# Patient Record
Sex: Female | Born: 1950 | Race: Black or African American | Hispanic: No
Health system: Midwestern US, Community
[De-identification: ages and names within clinical notes are randomized; demographics above are authoritative.]

## PROBLEM LIST (undated history)

## (undated) DIAGNOSIS — I10 Essential (primary) hypertension: Secondary | ICD-10-CM

## (undated) DIAGNOSIS — T8859XA Other complications of anesthesia, initial encounter: Secondary | ICD-10-CM

## (undated) DIAGNOSIS — D649 Anemia, unspecified: Secondary | ICD-10-CM

## (undated) DIAGNOSIS — M1990 Unspecified osteoarthritis, unspecified site: Secondary | ICD-10-CM

## (undated) DIAGNOSIS — T4145XA Adverse effect of unspecified anesthetic, initial encounter: Secondary | ICD-10-CM

## (undated) DIAGNOSIS — R112 Nausea with vomiting, unspecified: Secondary | ICD-10-CM

## (undated) DIAGNOSIS — Z9889 Other specified postprocedural states: Secondary | ICD-10-CM

## (undated) DIAGNOSIS — K219 Gastro-esophageal reflux disease without esophagitis: Secondary | ICD-10-CM

## (undated) DIAGNOSIS — E119 Type 2 diabetes mellitus without complications: Secondary | ICD-10-CM

## (undated) DIAGNOSIS — I495 Sick sinus syndrome: Secondary | ICD-10-CM

## (undated) DIAGNOSIS — I499 Cardiac arrhythmia, unspecified: Secondary | ICD-10-CM

## (undated) HISTORY — PX: BREAST SURGERY: SHX581

## (undated) HISTORY — PX: ABDOMINAL HYSTERECTOMY: SHX81

## (undated) HISTORY — PX: TUBAL LIGATION: SHX77

## (undated) HISTORY — PX: CHOLECYSTECTOMY: SHX55

---

## 2004-08-19 ENCOUNTER — Ambulatory Visit (HOSPITAL_COMMUNITY): Admission: RE | Admit: 2004-08-19 | Discharge: 2004-08-19 | Payer: Self-pay | Admitting: Gastroenterology

## 2006-05-16 ENCOUNTER — Encounter: Admission: RE | Admit: 2006-05-16 | Discharge: 2006-05-16 | Payer: Self-pay | Admitting: General Surgery

## 2007-05-21 ENCOUNTER — Encounter: Admission: RE | Admit: 2007-05-21 | Discharge: 2007-05-21 | Payer: Self-pay | Admitting: Internal Medicine

## 2008-05-21 ENCOUNTER — Encounter: Admission: RE | Admit: 2008-05-21 | Discharge: 2008-05-21 | Payer: Self-pay | Admitting: Internal Medicine

## 2009-05-24 ENCOUNTER — Encounter: Admission: RE | Admit: 2009-05-24 | Discharge: 2009-05-24 | Payer: Self-pay | Admitting: Family Medicine

## 2010-05-31 ENCOUNTER — Encounter: Admission: RE | Admit: 2010-05-31 | Discharge: 2010-05-31 | Payer: Self-pay | Admitting: Family Medicine

## 2010-12-16 NOTE — Op Note (Signed)
NAMERUBYLEE, ZAMARRIPA              ACCOUNT NO.:  [id number]   MEDICAL RECORD NO.:  [id number]          PATIENT TYPE:  AMB   LOCATION:  ENDO                         FACILITY:  [facility]   PHYSICIAN:  Anselmo Rod, M.D.  DATE OF BIRTH:  01-14-1951   DATE OF PROCEDURE:  08/19/2004  DATE OF DISCHARGE:                                 OPERATIVE REPORT   PROCEDURE PERFORMED:  Screening colonoscopy.   ENDOSCOPIST:  Anselmo Rod, M.D.   INSTRUMENT USED:  Olympus video colonoscope.   INDICATION FOR PROCEDURE:  A 60 year old African-American female underwent  screening colonoscopy to rule out colonic polyps, masses, etc.  Procedure  preparation, informed consent was secured from the patient, and the patient  fasted for eight hours prior to the procedure and prepped with a bottle of  magnesium citrate and a gallon of GoLytely the night prior to the procedure.  Risks and benefits of the procedure including a 10% miss rate of cancer and  polyps was discussed with her, as well.   PREPROCEDURE PHYSICAL EXAMINATION:  The patient had stable vital signs.  Neck supple.  Chest clear to auscultation.  S1, S2 regular.  Abdomen soft  with normal bowel sounds.   DESCRIPTION OF PROCEDURE:  The patient was placed in the left lateral  decubitus position, sedated with 75 mg of Demerol and 6 mg of Versed in slow  incremental doses.  Once the patient was adequately sedated and maintained  on low-flow oxygen with continuous cardiac monitoring.  The Olympus video  colonoscope was advanced from the rectum to the cecum.  The appendiceal  orifice and ileocecal valve were clearly visualized and photographed.  The  terminal ileum appeared healthy and without lesions.  No masses, polyps,  erosions, ulcerations, etc., were seen.  There was an isolated diverticulum  present at 100 cm.  Retroflexion in the rectum revealed no abnormalities.  The patient tolerated the procedure well without immediate complications.   IMPRESSION:  1.  Isolated diverticulum at 100 cm, otherwise unrevealing colonoscopy to      the terminal ileum.  2.  No masses or polyps seen.   RECOMMENDATIONS:  1.  A repeat colonoscopy has been recommended in the next five years unless      the patient develops any abnormal      symptoms in the interim.  2.  Continue a high fiber diet with a liberal fluid intake.  3.  Outpatient follow-up as need arises in the future.      Jyot   JNM/MEDQ  D:  08/19/2004  T:  08/19/2004  Job:  [id number]   cc:   Gabriel Earing, M.D.  [address]  [place]  [place] [address]  Fax: [phone]

## 2011-04-26 ENCOUNTER — Other Ambulatory Visit: Payer: Self-pay | Admitting: Family Medicine

## 2011-04-26 DIAGNOSIS — N644 Mastodynia: Secondary | ICD-10-CM

## 2011-05-05 ENCOUNTER — Ambulatory Visit
Admission: RE | Admit: 2011-05-05 | Discharge: 2011-05-05 | Disposition: A | Discharge status: Home / Self Care | Payer: 59 | Source: Ambulatory Visit | Attending: Family Medicine | Admitting: Family Medicine

## 2011-05-05 DIAGNOSIS — N644 Mastodynia: Secondary | ICD-10-CM

## 2011-05-31 ENCOUNTER — Other Ambulatory Visit: Payer: Self-pay | Admitting: Family Medicine

## 2011-05-31 DIAGNOSIS — Z1231 Encounter for screening mammogram for malignant neoplasm of breast: Secondary | ICD-10-CM

## 2011-06-05 ENCOUNTER — Ambulatory Visit
Admission: RE | Admit: 2011-06-05 | Discharge: 2011-06-05 | Disposition: A | Discharge status: Home / Self Care | Payer: 59 | Source: Ambulatory Visit | Attending: Family Medicine | Admitting: Family Medicine

## 2011-06-05 DIAGNOSIS — Z1231 Encounter for screening mammogram for malignant neoplasm of breast: Secondary | ICD-10-CM

## 2012-03-29 IMAGING — MG MM DIGITAL SCREENING BILAT W/ CAD
5 series · 5 of 5 positions shown · non-contrast
Comparison: none

DG SCREEN MAMMOGRAM BILATERAL
Bilateral CC and MLO view(s) were taken.

DIGITAL SCREENING MAMMOGRAM WITH CAD:
There are scattered fibroglandular densities.  No masses or malignant type calcifications are 
identified.  Compared with prior studies.
Images were processed with CAD.

[R CC]
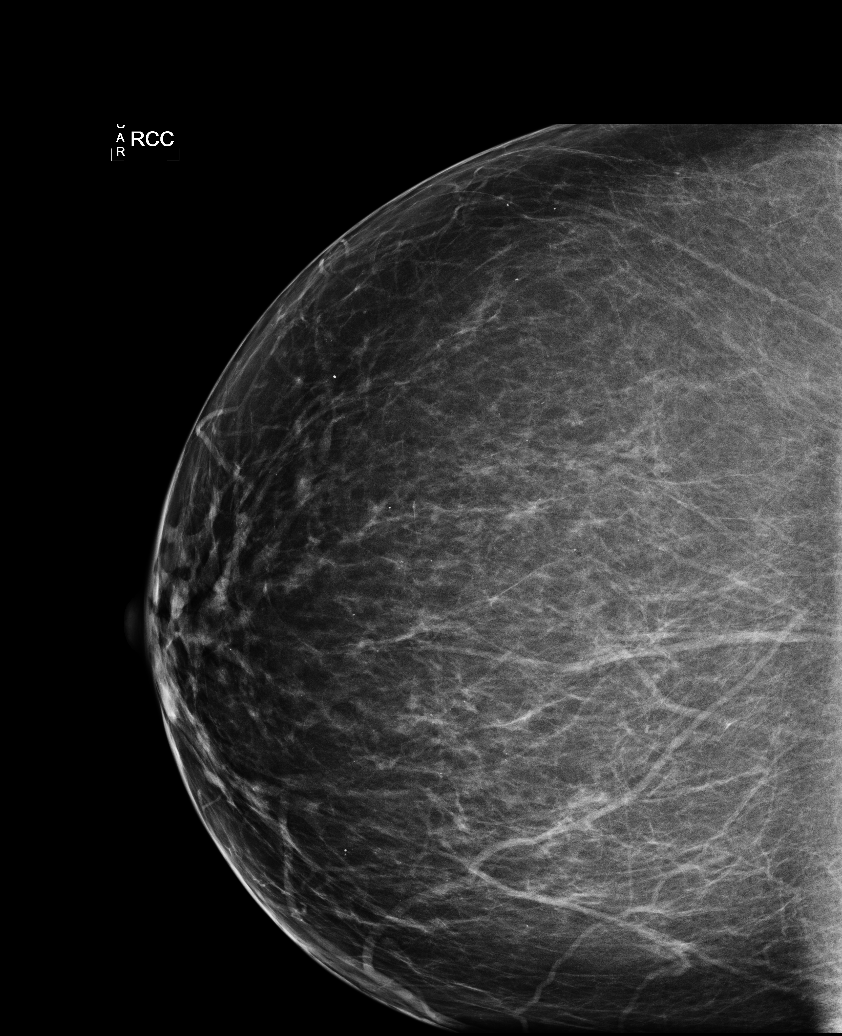

[L CC]
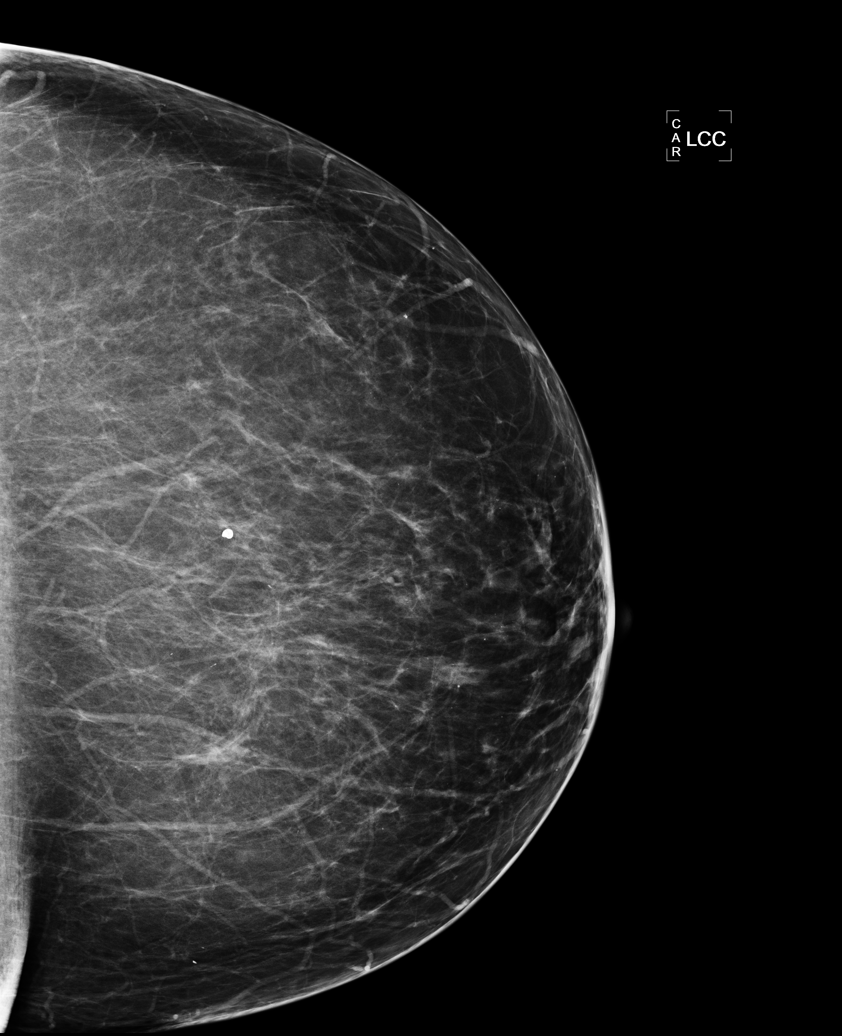

[L MLO]
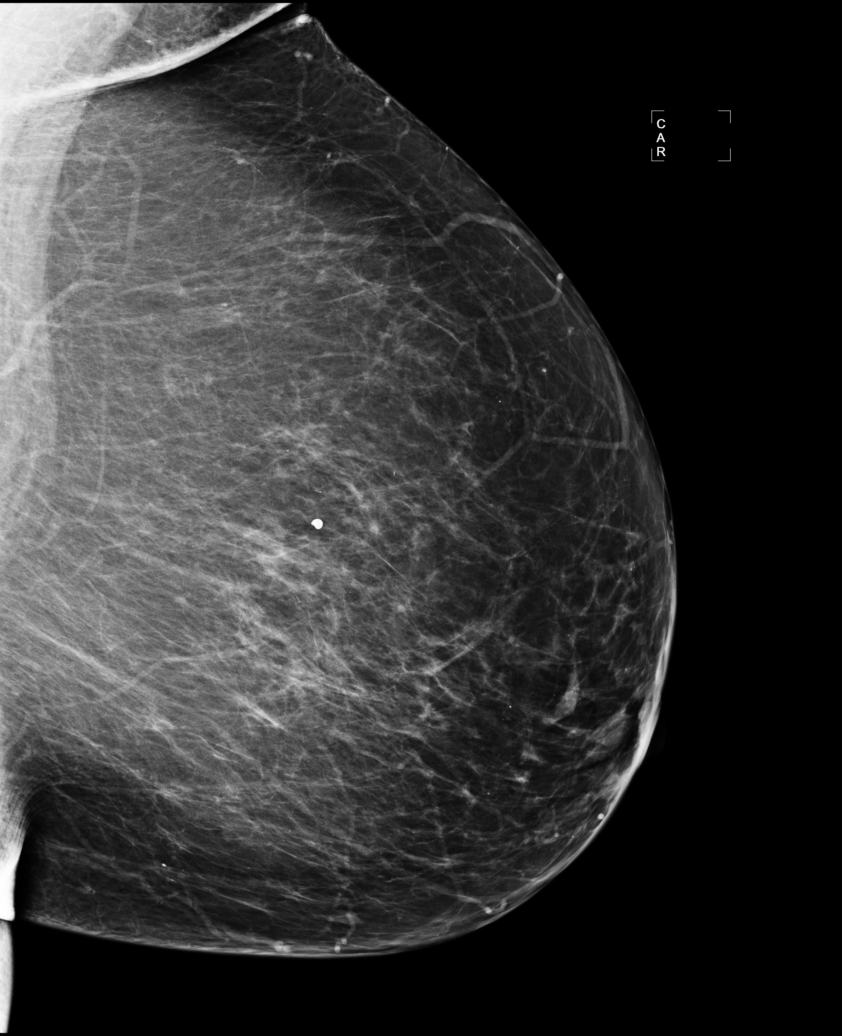

[R MLO]
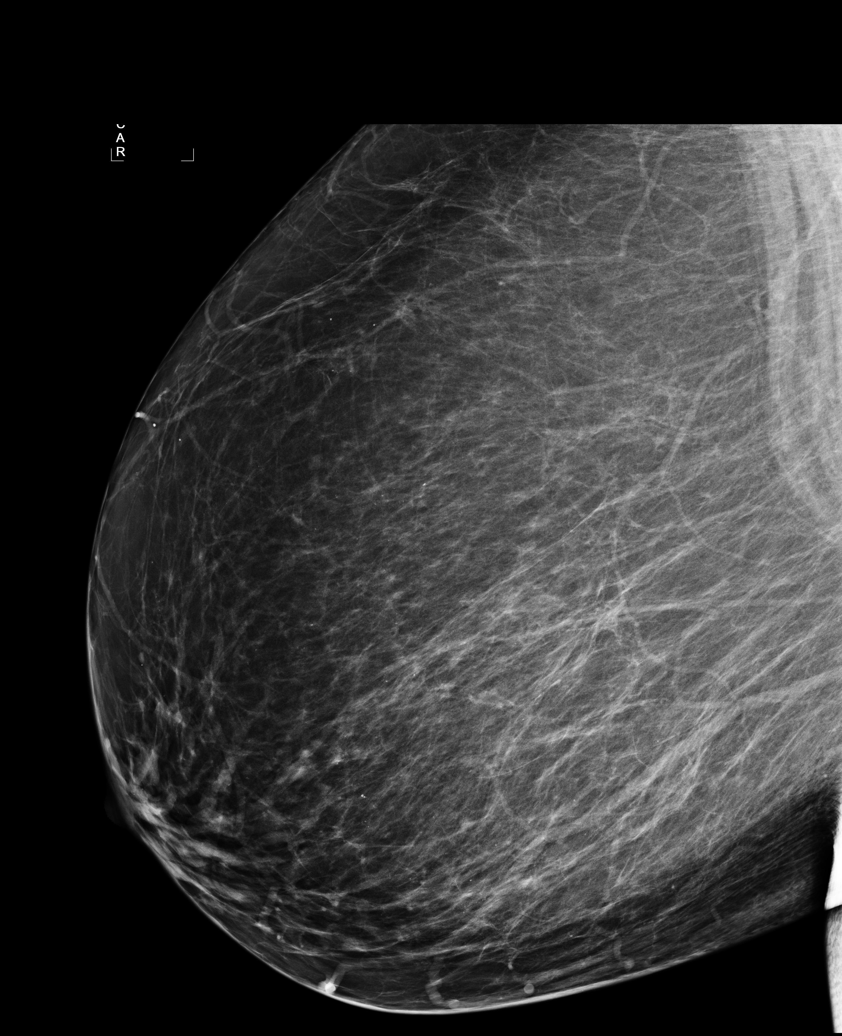

[L CV]
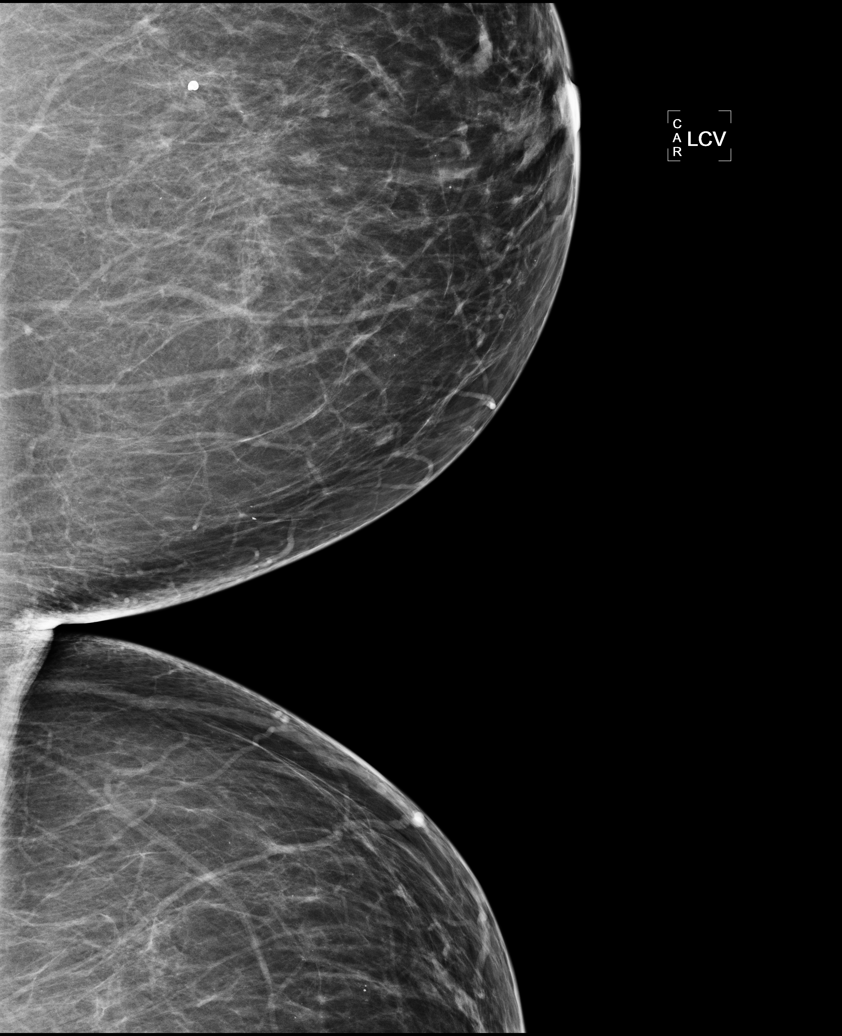

[5 of 5 positions shown; findings below may reference images not displayed]

IMPRESSION: No specific mammographic evidence of malignancy.  Next screening mammogram is recommended in one 
year.

A result letter of this screening mammogram will be mailed directly to the patient.

ASSESSMENT: Negative - BI-RADS 1

Screening mammogram in 1 year.
,

## 2012-05-14 ENCOUNTER — Other Ambulatory Visit: Payer: Self-pay | Admitting: Family Medicine

## 2012-05-14 DIAGNOSIS — Z1231 Encounter for screening mammogram for malignant neoplasm of breast: Secondary | ICD-10-CM

## 2012-06-05 ENCOUNTER — Ambulatory Visit
Admission: RE | Admit: 2012-06-05 | Discharge: 2012-06-05 | Disposition: A | Discharge status: Home / Self Care | Payer: 59 | Source: Ambulatory Visit | Attending: Family Medicine | Admitting: Family Medicine

## 2012-06-05 DIAGNOSIS — Z1231 Encounter for screening mammogram for malignant neoplasm of breast: Secondary | ICD-10-CM

## 2013-04-25 ENCOUNTER — Encounter (HOSPITAL_COMMUNITY): Payer: Self-pay | Admitting: Pharmacy Technician

## 2013-04-25 ENCOUNTER — Other Ambulatory Visit: Payer: Self-pay | Admitting: Orthopaedic Surgery

## 2013-04-26 NOTE — Pre-Procedure Instructions (Signed)
Maytal Mijangos  04/26/2013   Your procedure is scheduled on:  October 2  Report to [facility] "A" [address] at 07:15 AM.  Call this number if you have problems the morning of surgery: [phone]   Remember:   Do not eat food or drink liquids after midnight.   Take these medicines the morning of surgery with A SIP OF WATER: Nexium, Allegra,   STOP Aspirin, Os- Cal, Vitamin D, Meloxicam today  Do not take Aspirin, Aleve, Naproxen, Advil, Ibuprofen, Vitamin, Herbs, or Supplements starting today  Do not wear jewelry, make-up or nail polish.  Do not wear lotions, powders, or perfumes. You may wear deodorant.  Do not shave 48 hours prior to surgery. Men may shave face and neck.  Do not bring valuables to the hospital.  [facility] is not responsible                  for any belongings or valuables.               Contacts, dentures or bridgework may not be worn into surgery.  Leave suitcase in the car. After surgery it may be brought to your room.  For patients admitted to the hospital, discharge time is determined by your                treatment team.               Special Instructions: Shower using CHG 2 nights before surgery and the night before surgery.  If you shower the day of surgery use CHG.  Use special wash - you have one bottle of CHG for all showers.  You should use approximately 1/3 of the bottle for each shower.   Please read over the following fact sheets that you were given: Pain Booklet, Coughing and Deep Breathing, Blood Transfusion Information, Total Joint Packet and Surgical Site Infection Prevention

## 2013-04-28 ENCOUNTER — Encounter (HOSPITAL_COMMUNITY)
Admission: RE | Admit: 2013-04-28 | Discharge: 2013-04-28 | Disposition: A | Discharge status: Home / Self Care | Payer: 59 | Source: Ambulatory Visit | Attending: Orthopaedic Surgery | Admitting: Orthopaedic Surgery

## 2013-04-28 ENCOUNTER — Ambulatory Visit (HOSPITAL_COMMUNITY)
Admission: RE | Admit: 2013-04-28 | Discharge: 2013-04-28 | Disposition: A | Discharge status: Home / Self Care | Payer: 59 | Source: Ambulatory Visit | Attending: Orthopaedic Surgery | Admitting: Orthopaedic Surgery

## 2013-04-28 ENCOUNTER — Encounter (HOSPITAL_COMMUNITY): Payer: Self-pay

## 2013-04-28 DIAGNOSIS — Z01812 Encounter for preprocedural laboratory examination: Secondary | ICD-10-CM | POA: Insufficient documentation

## 2013-04-28 DIAGNOSIS — Z01818 Encounter for other preprocedural examination: Secondary | ICD-10-CM | POA: Insufficient documentation

## 2013-04-28 DIAGNOSIS — Z01810 Encounter for preprocedural cardiovascular examination: Secondary | ICD-10-CM | POA: Insufficient documentation

## 2013-04-28 HISTORY — DX: Gastro-esophageal reflux disease without esophagitis: K21.9

## 2013-04-28 HISTORY — DX: Adverse effect of unspecified anesthetic, initial encounter: T41.45XA

## 2013-04-28 HISTORY — DX: Other specified postprocedural states: R11.2

## 2013-04-28 HISTORY — DX: Essential (primary) hypertension: I10

## 2013-04-28 HISTORY — DX: Anemia, unspecified: D64.9

## 2013-04-28 HISTORY — DX: Type 2 diabetes mellitus without complications: E11.9

## 2013-04-28 HISTORY — DX: Other specified postprocedural states: Z98.890

## 2013-04-28 HISTORY — DX: Other complications of anesthesia, initial encounter: T88.59XA

## 2013-04-28 HISTORY — DX: Unspecified osteoarthritis, unspecified site: M19.90

## 2013-04-28 LAB — BASIC METABOLIC PANEL
BUN: 23 mg/dL (ref 6–23)
CO2: 26 mEq/L (ref 19–32)
Chloride: 99 mEq/L (ref 96–112)
GFR calc Af Amer: 73 mL/min — ABNORMAL LOW (ref >90)
GFR calc non Af Amer: 63 mL/min — ABNORMAL LOW (ref >90)
Potassium: 3.7 mEq/L (ref 3.5–5.1)
Sodium: 136 mEq/L (ref 135–145)

## 2013-04-28 LAB — CBC WITH DIFFERENTIAL/PLATELET
Eosinophils Absolute: 0.1 10*3/uL (ref 0.0–0.7)
HCT: 33.5 % — ABNORMAL LOW (ref 36.0–46.0)
Hemoglobin: 11.1 g/dL — ABNORMAL LOW (ref 12.0–15.0)
Lymphocytes Relative: 50 % — ABNORMAL HIGH (ref 12–46)
Lymphs Abs: 3.2 10*3/uL (ref 0.7–4.0)
MCHC: 33.1 g/dL (ref 30.0–36.0)
MCV: 85.9 fL (ref 78.0–100.0)
Monocytes Absolute: 0.4 10*3/uL (ref 0.1–1.0)
Monocytes Relative: 6 % (ref 3–12)
Neutro Abs: 2.7 10*3/uL (ref 1.7–7.7)
Neutrophils Relative %: 41 % — ABNORMAL LOW (ref 43–77)
RBC: 3.9 MIL/uL (ref 3.87–5.11)
WBC: 6.5 10*3/uL (ref 4.0–10.5)

## 2013-04-28 LAB — PROTIME-INR
INR: 0.96 (ref 0.00–1.49)
Prothrombin Time: 12.6 seconds (ref 11.6–15.2)

## 2013-04-28 LAB — URINALYSIS, ROUTINE W REFLEX MICROSCOPIC
Bilirubin Urine: NEGATIVE
Hgb urine dipstick: NEGATIVE
Ketones, ur: NEGATIVE mg/dL
Nitrite: NEGATIVE
Protein, ur: NEGATIVE mg/dL
pH: 5.5 (ref 5.0–8.0)

## 2013-04-28 LAB — ABO/RH: ABO/RH(D): A POS

## 2013-04-28 LAB — URINE MICROSCOPIC-ADD ON

## 2013-04-28 NOTE — Progress Notes (Signed)
Previous ekg was in [place], prior to her move here in 2005. Pt. Reports long ago she had a stress test as a baseline, told that it was normal.

## 2013-04-28 NOTE — H&P (Signed)
TOTAL KNEE ADMISSION H&P  Patient is being admitted for left total knee arthroplasty.  Subjective:  Chief Complaint:left knee pain.  HPI: Priscilla Brooks, 62 y.o. female, has a history of pain and functional disability in the left knee due to arthritis and has failed non-surgical conservative treatments for greater than 12 weeks to includeNSAID's and/or analgesics, corticosteriod injections, viscosupplementation injections, supervised PT with diminished ADL's post treatment, weight reduction as appropriate and activity modification.  Onset of symptoms was gradual, starting 6 years ago with gradually worsening course since that time. The patient noted no past surgery on the left knee(s).  Patient currently rates pain in the left knee(s) at 9 out of 10 with activity. Patient has night pain, worsening of pain with activity and weight bearing, pain that interferes with activities of daily living and crepitus.  Patient has evidence of subchondral sclerosis, periarticular osteophytes and joint space narrowing by imaging studies. This patient has had none. There is no active infection.  There are no active problems to display for this patient.  Past Medical History  Diagnosis Date  . Complication of anesthesia     wakes slowly  . PONV (postoperative nausea and vomiting)   . Hypertension   . Diabetes mellitus without complication   . GERD (gastroesophageal reflux disease)   . Arthritis     L knees, low back   . Anemia     told that she needs iron daily    Past Surgical History  Procedure Laterality Date  . Abdominal hysterectomy    . Cholecystectomy    . Breast surgery      benign x2- L breast  . Tubal ligation      No prescriptions prior to admission   No Known Allergies  History  Substance Use Topics  . Smoking status: Never Smoker   . Smokeless tobacco: Not on file  . Alcohol Use: Yes     Comment: social - spec. occas    No family history on file.   Review of Systems   Constitutional: Negative.   HENT: Negative.   Eyes: Negative.   Respiratory: Negative.   Cardiovascular: Negative.   Gastrointestinal: Negative.   Genitourinary: Negative.   Musculoskeletal: Positive for joint pain.  Skin: Negative.   Neurological: Negative.   Psychiatric/Behavioral: Negative.     Objective:  Physical Exam  Constitutional: She appears well-nourished.  HENT:  Head: Atraumatic.  Eyes: Conjunctivae are normal.  Neck: Neck supple.  Cardiovascular: Regular rhythm.   Respiratory: Breath sounds normal.  GI: Soft.  Musculoskeletal:  Left knee exam: Range of motion 0-1 10.  Crepitation 1+.  Pain along the medial joint line.  No fluid.  Neurological: She is alert.  Skin: Skin is dry.  Psychiatric: She has a normal mood and affect.    Vital signs in last 24 hours: Temp:  [98.3 F (36.8 C)] 98.3 F (36.8 C) (09/29 1014) Pulse Rate:  [67] 67 (09/29 1014) Resp:  [20] 20 (09/29 1014) BP: (102)/(70) 102/70 mmHg (09/29 1014) SpO2:  [99 %] 99 % (09/29 1014) Weight:  [120.203 kg (265 lb)] 120.203 kg (265 lb) (09/29 1014)  Labs:   There is no height or weight on file to calculate BMI.   Imaging Review Plain radiographs demonstrate severe degenerative joint disease of the left knee(s). The overall alignment isneutral. The bone quality appears to be good for age and reported activity level.  Assessment/Plan:  End stage arthritis, left knee   The patient history, physical examination, clinical judgment  of the provider and imaging studies are consistent with end stage degenerative joint disease of the left knee(s) and total knee arthroplasty is deemed medically necessary. The treatment options including medical management, injection therapy arthroscopy and arthroplasty were discussed at length. The risks and benefits of total knee arthroplasty were presented and reviewed. The risks due to aseptic loosening, infection, stiffness, patella tracking problems,  thromboembolic complications and other imponderables were discussed. The patient acknowledged the explanation, agreed to proceed with the plan and consent was signed. Patient is being admitted for inpatient treatment for surgery, pain control, PT, OT, prophylactic antibiotics, VTE prophylaxis, progressive ambulation and ADL's and discharge planning. The patient is planning to be discharged home with home health services

## 2013-04-28 NOTE — Progress Notes (Signed)
Call to Dr. Jerl Santos, requested that orders be signed.

## 2013-04-30 MED ORDER — DEXTROSE 5 % IV SOLN
3.0000 g | INTRAVENOUS | Status: AC
  Administered 2013-05-01: 3 g via INTRAVENOUS
  Filled 2013-04-30: qty 3000

## 2013-05-01 ENCOUNTER — Encounter (HOSPITAL_COMMUNITY)
Admission: RE | Disposition: A | Discharge status: Skilled Nursing Facility | Payer: Self-pay | Source: Ambulatory Visit | Attending: Orthopaedic Surgery

## 2013-05-01 ENCOUNTER — Inpatient Hospital Stay (HOSPITAL_COMMUNITY)
Admission: RE | Admit: 2013-05-01 | Discharge: 2013-05-05 | DRG: 470 | Disposition: A | Discharge status: Skilled Nursing Facility | Payer: 59 | Source: Ambulatory Visit | Attending: Orthopaedic Surgery | Admitting: Orthopaedic Surgery

## 2013-05-01 ENCOUNTER — Encounter (HOSPITAL_COMMUNITY): Payer: Self-pay | Admitting: Critical Care Medicine

## 2013-05-01 ENCOUNTER — Inpatient Hospital Stay (HOSPITAL_COMMUNITY): Payer: 59 | Admitting: Critical Care Medicine

## 2013-05-01 DIAGNOSIS — E119 Type 2 diabetes mellitus without complications: Secondary | ICD-10-CM | POA: Diagnosis present

## 2013-05-01 DIAGNOSIS — D62 Acute posthemorrhagic anemia: Secondary | ICD-10-CM | POA: Diagnosis not present

## 2013-05-01 DIAGNOSIS — K219 Gastro-esophageal reflux disease without esophagitis: Secondary | ICD-10-CM | POA: Diagnosis present

## 2013-05-01 DIAGNOSIS — Z7982 Long term (current) use of aspirin: Secondary | ICD-10-CM

## 2013-05-01 DIAGNOSIS — M1710 Unilateral primary osteoarthritis, unspecified knee: Secondary | ICD-10-CM | POA: Insufficient documentation

## 2013-05-01 DIAGNOSIS — M1712 Unilateral primary osteoarthritis, left knee: Secondary | ICD-10-CM

## 2013-05-01 DIAGNOSIS — I10 Essential (primary) hypertension: Secondary | ICD-10-CM | POA: Diagnosis present

## 2013-05-01 DIAGNOSIS — M25569 Pain in unspecified knee: Secondary | ICD-10-CM | POA: Diagnosis present

## 2013-05-01 DIAGNOSIS — Z9089 Acquired absence of other organs: Secondary | ICD-10-CM

## 2013-05-01 DIAGNOSIS — Z79899 Other long term (current) drug therapy: Secondary | ICD-10-CM

## 2013-05-01 DIAGNOSIS — Z9851 Tubal ligation status: Secondary | ICD-10-CM

## 2013-05-01 DIAGNOSIS — Z23 Encounter for immunization: Secondary | ICD-10-CM

## 2013-05-01 HISTORY — PX: TOTAL KNEE ARTHROPLASTY: SHX125

## 2013-05-01 LAB — GLUCOSE, CAPILLARY
Glucose-Capillary: 100 mg/dL — ABNORMAL HIGH (ref 70–99)
Glucose-Capillary: 177 mg/dL — ABNORMAL HIGH (ref 70–99)
Glucose-Capillary: 143 mg/dL — ABNORMAL HIGH (ref 70–99)
Glucose-Capillary: 151 mg/dL — ABNORMAL HIGH (ref 70–99)

## 2013-05-01 SURGERY — ARTHROPLASTY, KNEE, TOTAL
Anesthesia: Regional | Site: Knee | Laterality: Left | Wound class: Clean

## 2013-05-01 MED ORDER — METHOCARBAMOL 100 MG/ML IJ SOLN
500.0000 mg | Freq: Four times a day (QID) | INTRAVENOUS | Status: DC | PRN
  Filled 2013-05-01: qty 5

## 2013-05-01 MED ORDER — HYDROCHLOROTHIAZIDE 12.5 MG PO CAPS
12.5000 mg | ORAL_CAPSULE | Freq: Every day | ORAL | Status: DC
  Administered 2013-05-02 – 2013-05-04 (×3): 12.5 mg via ORAL
  Filled 2013-05-01 (×4): qty 1

## 2013-05-01 MED ORDER — ONDANSETRON HCL 4 MG/2ML IJ SOLN: 4.0000 mg | Freq: Four times a day (QID) | INTRAMUSCULAR | Status: DC | PRN

## 2013-05-01 MED ORDER — MENTHOL 3 MG MT LOZG: 1.0000 | LOZENGE | OROMUCOSAL | Status: DC | PRN

## 2013-05-01 MED ORDER — METOCLOPRAMIDE HCL 5 MG/ML IJ SOLN
INTRAMUSCULAR | Status: DC | PRN
  Administered 2013-05-01: 10 mg via INTRAVENOUS

## 2013-05-01 MED ORDER — LISINOPRIL-HYDROCHLOROTHIAZIDE 20-12.5 MG PO TABS: 1.0000 | ORAL_TABLET | Freq: Every day | ORAL | Status: DC

## 2013-05-01 MED ORDER — METOCLOPRAMIDE HCL 5 MG/ML IJ SOLN
INTRAMUSCULAR | Status: AC
  Filled 2013-05-01: qty 2

## 2013-05-01 MED ORDER — HYDROMORPHONE HCL PF 1 MG/ML IJ SOLN
0.5000 mg | INTRAMUSCULAR | Status: DC | PRN
  Administered 2013-05-02: 1 mg via INTRAVENOUS
  Filled 2013-05-01: qty 1

## 2013-05-01 MED ORDER — DOCUSATE SODIUM 100 MG PO CAPS
100.0000 mg | ORAL_CAPSULE | Freq: Two times a day (BID) | ORAL | Status: DC
  Administered 2013-05-01 – 2013-05-05 (×8): 100 mg via ORAL
  Filled 2013-05-01 (×8): qty 1

## 2013-05-01 MED ORDER — HYDROMORPHONE HCL PF 1 MG/ML IJ SOLN
INTRAMUSCULAR | Status: AC
  Filled 2013-05-01: qty 1

## 2013-05-01 MED ORDER — ASPIRIN EC 325 MG PO TBEC
325.0000 mg | DELAYED_RELEASE_TABLET | Freq: Two times a day (BID) | ORAL | Status: DC
  Administered 2013-05-02 – 2013-05-05 (×7): 325 mg via ORAL
  Filled 2013-05-01 (×9): qty 1

## 2013-05-01 MED ORDER — FERROUS SULFATE 325 (65 FE) MG PO TABS
325.0000 mg | ORAL_TABLET | Freq: Every day | ORAL | Status: DC
  Administered 2013-05-02 – 2013-05-04 (×3): 325 mg via ORAL
  Filled 2013-05-01 (×5): qty 1

## 2013-05-01 MED ORDER — HYDROMORPHONE HCL PF 1 MG/ML IJ SOLN
0.2500 mg | INTRAMUSCULAR | Status: DC | PRN
  Administered 2013-05-01 (×2): 0.5 mg via INTRAVENOUS

## 2013-05-01 MED ORDER — METOCLOPRAMIDE HCL 5 MG/ML IJ SOLN
5.0000 mg | Freq: Three times a day (TID) | INTRAMUSCULAR | Status: DC | PRN
  Administered 2013-05-01: 10 mg via INTRAVENOUS

## 2013-05-01 MED ORDER — HYDROCODONE-ACETAMINOPHEN 5-325 MG PO TABS
ORAL_TABLET | ORAL | Status: AC
  Filled 2013-05-01: qty 3

## 2013-05-01 MED ORDER — TRANEXAMIC ACID 100 MG/ML IV SOLN
1000.0000 mg | INTRAVENOUS | Status: AC
  Administered 2013-05-01: 1000 mg via INTRAVENOUS
  Filled 2013-05-01: qty 10

## 2013-05-01 MED ORDER — ATORVASTATIN CALCIUM 10 MG PO TABS
10.0000 mg | ORAL_TABLET | Freq: Every day | ORAL | Status: DC
  Administered 2013-05-01 – 2013-05-04 (×4): 10 mg via ORAL
  Filled 2013-05-01 (×5): qty 1

## 2013-05-01 MED ORDER — BISACODYL 10 MG RE SUPP
10.0000 mg | Freq: Every day | RECTAL | Status: DC | PRN
  Administered 2013-05-05: 10 mg via RECTAL
  Filled 2013-05-01: qty 1

## 2013-05-01 MED ORDER — LISINOPRIL 20 MG PO TABS
20.0000 mg | ORAL_TABLET | Freq: Every day | ORAL | Status: DC
  Administered 2013-05-02 – 2013-05-04 (×3): 20 mg via ORAL
  Filled 2013-05-01 (×4): qty 1

## 2013-05-01 MED ORDER — MIDAZOLAM HCL 2 MG/2ML IJ SOLN
1.0000 mg | INTRAMUSCULAR | Status: DC | PRN
Start: 2013-05-01 — End: 2013-05-01
  Administered 2013-05-01: 1 mg via INTRAVENOUS
  Filled 2013-05-01: qty 2

## 2013-05-01 MED ORDER — ACETAMINOPHEN 325 MG PO TABS: 650.0000 mg | ORAL_TABLET | Freq: Four times a day (QID) | ORAL | Status: DC | PRN

## 2013-05-01 MED ORDER — GLYCOPYRROLATE 0.2 MG/ML IJ SOLN
INTRAMUSCULAR | Status: DC | PRN
  Administered 2013-05-01: 0.4 mg via INTRAVENOUS

## 2013-05-01 MED ORDER — PANTOPRAZOLE SODIUM 40 MG PO TBEC
80.0000 mg | DELAYED_RELEASE_TABLET | Freq: Every day | ORAL | Status: DC
  Administered 2013-05-02 – 2013-05-05 (×4): 80 mg via ORAL
  Filled 2013-05-01 (×3): qty 2

## 2013-05-01 MED ORDER — SODIUM CHLORIDE 0.9 % IR SOLN
Status: DC | PRN
  Administered 2013-05-01: 3000 mL

## 2013-05-01 MED ORDER — INSULIN ASPART 100 UNIT/ML ~~LOC~~ SOLN
0.0000 [IU] | Freq: Three times a day (TID) | SUBCUTANEOUS | Status: DC
  Administered 2013-05-01: 3 [IU] via SUBCUTANEOUS

## 2013-05-01 MED ORDER — METOCLOPRAMIDE HCL 5 MG PO TABS
5.0000 mg | ORAL_TABLET | Freq: Three times a day (TID) | ORAL | Status: DC | PRN
  Filled 2013-05-01: qty 2

## 2013-05-01 MED ORDER — ALUM & MAG HYDROXIDE-SIMETH 200-200-20 MG/5ML PO SUSP: 30.0000 mL | ORAL | Status: DC | PRN

## 2013-05-01 MED ORDER — BUPIVACAINE-EPINEPHRINE PF 0.5-1:200000 % IJ SOLN
INTRAMUSCULAR | Status: DC | PRN
  Administered 2013-05-01: 30 mL

## 2013-05-01 MED ORDER — HYDROCODONE-ACETAMINOPHEN 5-325 MG PO TABS
ORAL_TABLET | ORAL | Status: AC
  Administered 2013-05-01: 3
  Filled 2013-05-01: qty 3

## 2013-05-01 MED ORDER — ACETAMINOPHEN 650 MG RE SUPP: 650.0000 mg | Freq: Four times a day (QID) | RECTAL | Status: DC | PRN

## 2013-05-01 MED ORDER — PROPOFOL 10 MG/ML IV BOLUS
INTRAVENOUS | Status: DC | PRN
  Administered 2013-05-01: 180 mg via INTRAVENOUS

## 2013-05-01 MED ORDER — METHOCARBAMOL 500 MG PO TABS
ORAL_TABLET | ORAL | Status: AC
  Filled 2013-05-01: qty 1

## 2013-05-01 MED ORDER — PIOGLITAZONE HCL 15 MG PO TABS
15.0000 mg | ORAL_TABLET | Freq: Every day | ORAL | Status: DC
  Administered 2013-05-02 – 2013-05-05 (×4): 15 mg via ORAL
  Filled 2013-05-01 (×5): qty 1

## 2013-05-01 MED ORDER — ROCURONIUM BROMIDE 100 MG/10ML IV SOLN
INTRAVENOUS | Status: DC | PRN
  Administered 2013-05-01: 50 mg via INTRAVENOUS

## 2013-05-01 MED ORDER — OXYCODONE HCL 5 MG/5ML PO SOLN: 5.0000 mg | Freq: Once | ORAL | Status: DC | PRN

## 2013-05-01 MED ORDER — ONDANSETRON HCL 4 MG/2ML IJ SOLN
INTRAMUSCULAR | Status: DC | PRN
  Administered 2013-05-01 (×2): 4 mg via INTRAVENOUS

## 2013-05-01 MED ORDER — ONDANSETRON HCL 4 MG PO TABS: 4.0000 mg | ORAL_TABLET | Freq: Four times a day (QID) | ORAL | Status: DC | PRN

## 2013-05-01 MED ORDER — FENTANYL CITRATE 0.05 MG/ML IJ SOLN
INTRAMUSCULAR | Status: DC | PRN
  Administered 2013-05-01 (×5): 50 ug via INTRAVENOUS

## 2013-05-01 MED ORDER — CEFAZOLIN SODIUM-DEXTROSE 2-3 GM-% IV SOLR
2.0000 g | Freq: Four times a day (QID) | INTRAVENOUS | Status: AC
  Administered 2013-05-01 (×2): 2 g via INTRAVENOUS
  Filled 2013-05-01 (×2): qty 50

## 2013-05-01 MED ORDER — DEXAMETHASONE SODIUM PHOSPHATE 4 MG/ML IJ SOLN
INTRAMUSCULAR | Status: DC | PRN
  Administered 2013-05-01: 4 mg via INTRAVENOUS

## 2013-05-01 MED ORDER — HYDROCODONE-ACETAMINOPHEN 7.5-325 MG PO TABS
1.0000 | ORAL_TABLET | ORAL | Status: DC | PRN
  Administered 2013-05-01 – 2013-05-05 (×11): 2 via ORAL
  Filled 2013-05-01 (×11): qty 2

## 2013-05-01 MED ORDER — NEOSTIGMINE METHYLSULFATE 1 MG/ML IJ SOLN
INTRAMUSCULAR | Status: DC | PRN
  Administered 2013-05-01: 3 mg via INTRAVENOUS

## 2013-05-01 MED ORDER — PHENOL 1.4 % MT LIQD: 1.0000 | OROMUCOSAL | Status: DC | PRN

## 2013-05-01 MED ORDER — DIPHENHYDRAMINE HCL 12.5 MG/5ML PO ELIX: 12.5000 mg | ORAL_SOLUTION | ORAL | Status: DC | PRN

## 2013-05-01 MED ORDER — FENTANYL CITRATE 0.05 MG/ML IJ SOLN
50.0000 ug | INTRAMUSCULAR | Status: DC | PRN
  Administered 2013-05-01: 100 ug via INTRAVENOUS
  Filled 2013-05-01: qty 2

## 2013-05-01 MED ORDER — LORATADINE 10 MG PO TABS
10.0000 mg | ORAL_TABLET | Freq: Every day | ORAL | Status: DC
  Administered 2013-05-02 – 2013-05-05 (×4): 10 mg via ORAL
  Filled 2013-05-01 (×4): qty 1

## 2013-05-01 MED ORDER — LACTATED RINGERS IV SOLN
INTRAVENOUS | Status: DC
  Administered 2013-05-01: 22:00:00 via INTRAVENOUS

## 2013-05-01 MED ORDER — PHENYLEPHRINE HCL 10 MG/ML IJ SOLN
INTRAMUSCULAR | Status: DC | PRN
  Administered 2013-05-01: 80 ug via INTRAVENOUS

## 2013-05-01 MED ORDER — OXYCODONE HCL 5 MG PO TABS: 5.0000 mg | ORAL_TABLET | Freq: Once | ORAL | Status: DC | PRN

## 2013-05-01 MED ORDER — LIDOCAINE HCL (CARDIAC) 20 MG/ML IV SOLN
INTRAVENOUS | Status: DC | PRN
  Administered 2013-05-01: 80 mg via INTRAVENOUS

## 2013-05-01 MED ORDER — METHOCARBAMOL 500 MG PO TABS
500.0000 mg | ORAL_TABLET | Freq: Four times a day (QID) | ORAL | Status: DC | PRN
  Administered 2013-05-01 – 2013-05-04 (×6): 500 mg via ORAL
  Filled 2013-05-01 (×5): qty 1

## 2013-05-01 MED ORDER — LACTATED RINGERS IV SOLN
INTRAVENOUS | Status: DC
  Administered 2013-05-01 (×2): via INTRAVENOUS

## 2013-05-01 SURGICAL SUPPLY — 67 items
BANDAGE ELASTIC 4 VELCRO ST LF (GAUZE/BANDAGES/DRESSINGS) ×2 IMPLANT
BANDAGE ELASTIC 6 VELCRO ST LF (GAUZE/BANDAGES/DRESSINGS) ×1 IMPLANT
BANDAGE ESMARK 6X9 LF (GAUZE/BANDAGES/DRESSINGS) ×1 IMPLANT
BANDAGE GAUZE ELAST BULKY 4 IN (GAUZE/BANDAGES/DRESSINGS) ×3 IMPLANT
BLADE SAGITTAL 25.0X1.19X90 (BLADE) ×2 IMPLANT
BLADE SURG ROTATE 9660 (MISCELLANEOUS) IMPLANT
BNDG CMPR 9X6 STRL LF SNTH (GAUZE/BANDAGES/DRESSINGS) ×1
BNDG CMPR MED 10X6 ELC LF (GAUZE/BANDAGES/DRESSINGS) ×1
BNDG ELASTIC 6X10 VLCR STRL LF (GAUZE/BANDAGES/DRESSINGS) ×2 IMPLANT
BNDG ESMARK 6X9 LF (GAUZE/BANDAGES/DRESSINGS) ×2
BOWL SMART MIX CTS (DISPOSABLE) ×2 IMPLANT
CAP UPCHARGE REVISION TRAY ×1 IMPLANT
CAPT RP KNEE ×1 IMPLANT
CEMENT HV SMART SET (Cement) ×4 IMPLANT
CLOTH BEACON ORANGE TIMEOUT ST (SAFETY) ×2 IMPLANT
COVER SURGICAL LIGHT HANDLE (MISCELLANEOUS) ×2 IMPLANT
CUFF TOURNIQUET SINGLE 34IN LL (TOURNIQUET CUFF) ×2 IMPLANT
CUFF TOURNIQUET SINGLE 44IN (TOURNIQUET CUFF) IMPLANT
DRAPE EXTREMITY T 121X128X90 (DRAPE) ×2 IMPLANT
DRAPE PROXIMA HALF (DRAPES) ×2 IMPLANT
DRAPE U-SHAPE 47X51 STRL (DRAPES) ×2 IMPLANT
DRSG ADAPTIC 3X8 NADH LF (GAUZE/BANDAGES/DRESSINGS) ×2 IMPLANT
DRSG PAD ABDOMINAL 8X10 ST (GAUZE/BANDAGES/DRESSINGS) ×2 IMPLANT
DURAPREP 26ML APPLICATOR (WOUND CARE) ×2 IMPLANT
ELECT REM PT RETURN 9FT ADLT (ELECTROSURGICAL) ×2
ELECTRODE REM PT RTRN 9FT ADLT (ELECTROSURGICAL) ×1 IMPLANT
FACESHIELD LNG OPTICON STERILE (SAFETY) ×5 IMPLANT
GLOVE BIO SURGEON STRL SZ8.5 (GLOVE) ×2 IMPLANT
GLOVE BIOGEL PI IND STRL 8 (GLOVE) ×1 IMPLANT
GLOVE BIOGEL PI IND STRL 8.5 (GLOVE) ×1 IMPLANT
GLOVE BIOGEL PI INDICATOR 8 (GLOVE) ×1
GLOVE BIOGEL PI INDICATOR 8.5 (GLOVE) ×1
GLOVE SS BIOGEL STRL SZ 8 (GLOVE) ×1 IMPLANT
GLOVE SUPERSENSE BIOGEL SZ 8 (GLOVE) ×1
GOWN PREVENTION PLUS XLARGE (GOWN DISPOSABLE) ×2 IMPLANT
GOWN PREVENTION PLUS XXLARGE (GOWN DISPOSABLE) ×2 IMPLANT
GOWN STRL NON-REIN LRG LVL3 (GOWN DISPOSABLE) ×2 IMPLANT
HANDPIECE INTERPULSE COAX TIP (DISPOSABLE) ×2
HOOD PEEL AWAY FACE SHEILD DIS (HOOD) ×2 IMPLANT
IMMOBILIZER KNEE 20 (SOFTGOODS)
IMMOBILIZER KNEE 20 THIGH 36 (SOFTGOODS) IMPLANT
IMMOBILIZER KNEE 22 UNIV (SOFTGOODS) ×2 IMPLANT
IMMOBILIZER KNEE 24 THIGH 36 (MISCELLANEOUS) IMPLANT
IMMOBILIZER KNEE 24 UNIV (MISCELLANEOUS)
KIT BASIN OR (CUSTOM PROCEDURE TRAY) ×2 IMPLANT
KIT ROOM TURNOVER OR (KITS) ×2 IMPLANT
MANIFOLD NEPTUNE II (INSTRUMENTS) ×2 IMPLANT
NDL HYPO 21X1 ECLIPSE (NEEDLE) ×1 IMPLANT
NEEDLE HYPO 21X1 ECLIPSE (NEEDLE) ×2 IMPLANT
NS IRRIG 1000ML POUR BTL (IV SOLUTION) ×2 IMPLANT
PACK TOTAL JOINT (CUSTOM PROCEDURE TRAY) ×2 IMPLANT
PAD ARMBOARD 7.5X6 YLW CONV (MISCELLANEOUS) ×4 IMPLANT
SET HNDPC FAN SPRY TIP SCT (DISPOSABLE) ×1 IMPLANT
SPONGE GAUZE 4X4 12PLY (GAUZE/BANDAGES/DRESSINGS) ×2 IMPLANT
STAPLER VISISTAT 35W (STAPLE) IMPLANT
SUCTION FRAZIER TIP 10 FR DISP (SUCTIONS) IMPLANT
SUT MNCRL AB 3-0 PS2 18 (SUTURE) IMPLANT
SUT VIC AB 0 CT1 27 (SUTURE) ×4
SUT VIC AB 0 CT1 27XBRD ANBCTR (SUTURE) ×2 IMPLANT
SUT VIC AB 2-0 CT1 27 (SUTURE) ×4
SUT VIC AB 2-0 CT1 TAPERPNT 27 (SUTURE) ×2 IMPLANT
SUT VLOC 180 0 24IN GS25 (SUTURE) ×2 IMPLANT
SYR 50ML LL SCALE MARK (SYRINGE) ×2 IMPLANT
TOWEL OR 17X24 6PK STRL BLUE (TOWEL DISPOSABLE) ×2 IMPLANT
TOWEL OR 17X26 10 PK STRL BLUE (TOWEL DISPOSABLE) ×2 IMPLANT
TRAY FOLEY CATH 14FR (SET/KITS/TRAYS/PACK) ×2 IMPLANT
WATER STERILE IRR 1000ML POUR (IV SOLUTION) ×4 IMPLANT

## 2013-05-01 NOTE — Interval H&P Note (Signed)
History and Physical Interval Note:  05/01/2013 8:42 AM  Priscilla Brooks  has presented today for surgery, with the diagnosis of LEFT KNEE DEGENERATIVE JOINT DISEASE   The various methods of treatment have been discussed with the patient and family. After consideration of risks, benefits and other options for treatment, the patient has consented to  Procedure(s): TOTAL KNEE ARTHROPLASTY (Left) as a surgical intervention .  The patient's history has been reviewed, patient examined, no change in status, stable for surgery.  I have reviewed the patient's chart and labs.  Questions were answered to the patient's satisfaction.     Oaklen Thiam G

## 2013-05-01 NOTE — Anesthesia Preprocedure Evaluation (Addendum)
Anesthesia Evaluation  Patient identified by MRN, date of birth, ID band Patient awake    Reviewed: Allergy & Precautions, H&P , NPO status , Patient's Chart, lab work & pertinent test results  History of Anesthesia Complications (+) PONV and PROLONGED EMERGENCE  Airway Mallampati: II  Neck ROM: full    Dental  (+) Dental Advisory Given   Pulmonary          Cardiovascular hypertension, Pt. on medications     Neuro/Psych    GI/Hepatic GERD-  Medicated,  Endo/Other  diabetes, Oral Hypoglycemic AgentsMorbid obesity  Renal/GU      Musculoskeletal  (+) Arthritis -,   Abdominal   Peds  Hematology   Anesthesia Other Findings   Reproductive/Obstetrics                          Anesthesia Physical Anesthesia Plan  ASA: III  Anesthesia Plan: General and Regional   Post-op Pain Management: MAC Combined w/ Regional for Post-op pain   Induction: Intravenous  Airway Management Planned: Oral ETT  Additional Equipment:   Intra-op Plan:   Post-operative Plan: Extubation in OR  Informed Consent: I have reviewed the patients History and Physical, chart, labs and discussed the procedure including the risks, benefits and alternatives for the proposed anesthesia with the patient or authorized representative who has indicated his/her understanding and acceptance.     Plan Discussed with: CRNA, Anesthesiologist and Surgeon  Anesthesia Plan Comments:         Anesthesia Quick Evaluation

## 2013-05-01 NOTE — Anesthesia Postprocedure Evaluation (Signed)
Anesthesia Post Note  Patient: Priscilla Brooks  Procedure(s) Performed: Procedure(s) (LRB): TOTAL KNEE ARTHROPLASTY (Left)  Anesthesia type: General  Patient location: PACU  Post pain: Pain level controlled and Adequate analgesia  Post assessment: Post-op Vital signs reviewed, Patient's Cardiovascular Status Stable, Respiratory Function Stable, Patent Airway and Pain level controlled  Last Vitals:  Filed Vitals:   05/01/13 1052  BP: 125/70  Pulse: 86  Temp: 36.6 C  Resp: 16    Post vital signs: Reviewed and stable  Level of consciousness: awake, alert  and oriented  Complications: No apparent anesthesia complications

## 2013-05-01 NOTE — Anesthesia Procedure Notes (Addendum)
Anesthesia Regional Block:  Femoral nerve block  Pre-Anesthetic Checklist: ,, timeout performed, Correct Patient, Correct Site, Correct Laterality, Correct Procedure,, site marked, risks and benefits discussed, Surgical consent,  Pre-op evaluation,  At surgeon's request and post-op pain management  Laterality: Left  Prep: chloraprep       Needles:  Injection technique: Single-shot  Needle Type: Echogenic Stimulator Needle     Needle Length: 9cm  Needle Gauge: 21 and 21 G    Additional Needles:  Procedures: nerve stimulator Femoral nerve block  Nerve Stimulator or Paresthesia:  Response: Quadriceps muscle contraction, 0.45 mA,   Additional Responses:   Narrative:  Start time: 05/01/2013 7:52 AM End time: 05/01/2013 8:02 AM Injection made incrementally with aspirations every 5 mL.  Performed by: Personally  Anesthesiologist: Dr Chaney Malling  Additional Notes: Functioning IV was confirmed and monitors were applied.  A 90mm 21ga Arrow echogenic stimulator needle was used. Sterile prep and drape,hand hygiene and sterile gloves were used.  Negative aspiration and negative test dose prior to incremental administration of local anesthetic. The patient tolerated the procedure well.    Femoral nerve block Procedure Name: Intubation Date/Time: 05/01/2013 8:53 AM Performed by: Elon Alas Pre-anesthesia Checklist: Patient identified, Timeout performed, Emergency Drugs available, Suction available and Patient being monitored Patient Re-evaluated:Patient Re-evaluated prior to inductionOxygen Delivery Method: Circle system utilized Preoxygenation: Pre-oxygenation with 100% oxygen Intubation Type: IV induction Ventilation: Mask ventilation without difficulty and Oral airway inserted - appropriate to patient size Laryngoscope Size: Miller and 2 Grade View: Grade I Tube type: Oral Tube size: 7.5 mm Number of attempts: 1 Airway Equipment and Method: Stylet Placement Confirmation:  positive ETCO2,  ETT inserted through vocal cords under direct vision and breath sounds checked- equal and bilateral Secured at: 22 cm Tube secured with: Tape Dental Injury: Teeth and Oropharynx as per pre-operative assessment

## 2013-05-01 NOTE — Progress Notes (Signed)
Orthopedic Tech Progress Note Patient Details:  Priscilla Brooks 1951-07-17 [id number] CPM applied to Left LE with appropriate settings. OHF applied to bed.  CPM Left Knee CPM Left Knee: On Left Knee Flexion (Degrees): 60 Left Knee Extension (Degrees): 0   Asia R Thompson 05/01/2013, 11:49 AM

## 2013-05-01 NOTE — Transfer of Care (Signed)
Immediate Anesthesia Transfer of Care Note  Patient: Priscilla Brooks  Procedure(s) Performed: Procedure(s): TOTAL KNEE ARTHROPLASTY (Left)  Patient Location: PACU  Anesthesia Type:General  Level of Consciousness: awake, alert  and oriented  Airway & Oxygen Therapy: Patient Spontanous Breathing and Patient connected to nasal cannula oxygen  Post-op Assessment: Report given to PACU RN, Post -op Vital signs reviewed and stable and Patient moving all extremities X 4  Post vital signs: Reviewed and stable  Complications: No apparent anesthesia complications

## 2013-05-01 NOTE — Preoperative (Signed)
Beta Blockers   Reason not to administer Beta Blockers:Not Applicable 

## 2013-05-01 NOTE — Op Note (Signed)
PREOP DIAGNOSIS: DJD LEFT KNEE POSTOP DIAGNOSIS:  same PROCEDURE: LEFT TKR ANESTHESIA: General and block ATTENDING SURGEON: Teyton Pattillo G ASSISTANT: Lindwood Qua PA and April Green RNFA  INDICATIONS FOR PROCEDURE: Priscilla Brooks is a 62 y.o. female who has struggled for a long time with pain due to degenerative arthritis of the left knee.  The patient has failed many conservative non-operative measures and at this point has pain which limits the ability to sleep and walk.  The patient is offered total knee replacement.  Informed operative consent was obtained after discussion of possible risks of anesthesia, infection, neurovascular injury, DVT, and death.  The importance of the post-operative rehabilitation protocol to optimize result was stressed extensively with the patient.  SUMMARY OF FINDINGS AND PROCEDURE:  Priscilla Brooks was taken to the operative suite where under the above anesthesia a left knee replacement was performed.  There were advanced degenerative changes and the bone quality was good.  We used the DePuy system and placed size standard plus femur, 5 MBT tibia, 38 mm all polyethylene patella, and a size 12.5 mm spacer.  The patient was admitted for appropriate post-op care to include perioperative antibiotics and mechanical and pharmacologic measures for DVT prophylaxis.  DESCRIPTION OF PROCEDURE:  Priscilla Brooks was taken to the operative suite where the above anesthesia was applied.  The patient was positioned supine and prepped and draped in normal sterile fashion.  An appropriate time out was performed.  After the administration of Kefzol pre-op antibiotic the leg was elevated and exsanguinated and a tourniquet inflated.  A standard longitudinal incision was made on the anterior knee.  Dissection was carried down to the extensor mechanism.  All appropriate anti-infective measures were used including the pre-operative antibiotic, betadine impregnated drape, and closed hooded  exhaust systems for each member of the surgical team.  A medial parapatellar incision was made in the extensor mechanism and the knee cap flipped and the knee flexed.  Some residual meniscal tissues were removed along with any remaining ACL/PCL tissue.  A guide was placed on the tibia and a flat cut was made on it's superior surface.  An intramedullary guide was placed in the femur and was utilized to make anterior and posterior cuts creating an appropriate flexion gap.  A second intramedullary guide was placed in the femur to make a distal cut properly balancing the knee with an extension gap equal to the flexion gap.  The three bones sized to the above mentioned sizes and the appropriate guides were placed and utilized.  A trial reduction was done and the knee easily came to full extension and the patella tracked well on flexion.  The trial components were removed and all bones were cleaned with pulsatile lavage and then dried thoroughly.  Cement was mixed and was pressurized onto the bones followed by placement of the aforementioned components.  Excess cement was trimmed and pressure was held on the components until the cement had hardened.  The tourniquet was deflated and a small amount of bleeding was controlled with cautery and pressure.  The knee was irrigated thoroughly.  The extensor mechanism was re-approximated with V-loc suture in running fashion.  The knee was flexed and the repair was solid.  The subcutaneous tissues were re-approximated with #0 and #2-0 vicryl and the skin closed with a subcuticular stitch and steristrips.  A sterile dressing was applied.  Intraoperative fluids, EBL, and tourniquet time can be obtained from anesthesia records.  DISPOSITION:  The patient was taken to recovery  room in stable condition and admitted for appropriate post-op care to include peri-operative antibiotic and DVT prophylaxis with mechanical and pharmacologic measures.  Priscilla Brooks G 05/01/2013, 10:22 AM

## 2013-05-02 ENCOUNTER — Encounter (HOSPITAL_COMMUNITY): Payer: Self-pay | Admitting: Orthopaedic Surgery

## 2013-05-02 LAB — CBC
Platelets: 225 10*3/uL (ref 150–400)
RBC: 3.16 MIL/uL — ABNORMAL LOW (ref 3.87–5.11)
RDW: 14.3 % (ref 11.5–15.5)
WBC: 10.2 10*3/uL (ref 4.0–10.5)

## 2013-05-02 LAB — GLUCOSE, CAPILLARY
Glucose-Capillary: 119 mg/dL — ABNORMAL HIGH (ref 70–99)
Glucose-Capillary: 117 mg/dL — ABNORMAL HIGH (ref 70–99)

## 2013-05-02 LAB — BASIC METABOLIC PANEL
Chloride: 103 mEq/L (ref 96–112)
GFR calc Af Amer: >90 mL/min (ref >90)
Potassium: 4.3 mEq/L (ref 3.5–5.1)

## 2013-05-02 MED ORDER — PNEUMOCOCCAL VAC POLYVALENT 25 MCG/0.5ML IJ INJ
0.5000 mL | INJECTION | INTRAMUSCULAR | Status: AC
  Administered 2013-05-03: 0.5 mL via INTRAMUSCULAR
  Filled 2013-05-02: qty 0.5

## 2013-05-02 NOTE — Progress Notes (Signed)
Physical Therapy Treatment Patient Details Name: Priscilla Brooks MRN: [id number] DOB: 13-Jan-1951 Today's Date: 05/02/2013 Time: 4098-1191 PT Time Calculation (min): 25 min  PT Assessment / Plan / Recommendation  History of Present Illness Pt with L TKA   PT Comments   Pt. Making good gains with mobility this pm.  Will continue therapy in the am and expect transfer to SNF to in next day or so.  Follow Up Recommendations  SNF;Supervision/Assistance - 24 hour;Supervision for mobility/OOB     Does the patient have the potential to tolerate intense rehabilitation     Barriers to Discharge        Equipment Recommendations  Rolling walker with 5" wheels    Recommendations for Other Services    Frequency 7X/week   Progress towards PT Goals Progress towards PT goals: Progressing toward goals  Plan Current plan remains appropriate    Precautions / Restrictions Precautions Precautions: Knee Precaution Comments: reviewed precautions Required Braces or Orthoses: Knee Immobilizer - Left Restrictions Weight Bearing Restrictions: Yes LLE Weight Bearing: Weight bearing as tolerated   Pertinent Vitals/Pain See vitals tab     Mobility  Bed Mobility Bed Mobility: Sit to Supine Sit to Supine: 4: Min assist;HOB flat Details for Bed Mobility Assistance: min assist for management of L LE and using bed blanket Transfers Transfers: Sit to Stand;Stand to Sit Sit to Stand: 4: Min guard;From chair/3-in-1;With upper extremity assist;With armrests Stand to Sit: 4: Min guard;To chair/3-in-1;To bed;With upper extremity assist;With armrests Details for Transfer Assistance: cues for safe technique and hand placement Ambulation/Gait Ambulation/Gait Assistance: 4: Min assist Ambulation Distance (Feet): 60 Feet Assistive device: Rolling walker Ambulation/Gait Assistance Details: Improved sequencing this pm, needed min assist for safety and stability Gait Pattern: Step-to pattern;Decreased hip/knee  flexion - left;Decreased stance time - left Gait velocity: decreased    Exercises     PT Diagnosis:    PT Problem List:   PT Treatment Interventions:     PT Goals (current goals can now be found in the care plan section) Acute Rehab PT Goals Patient Stated Goal: To go home after rehab at SNF  Visit Information  Last PT Received On: 05/02/13 Assistance Needed: +1 History of Present Illness: Pt with L TKA    Subjective Data  Subjective: Pt. reports she still has not been able to urinate since foley DC'd Patient Stated Goal: To go home after rehab at [facility]   Cognition  Cognition Arousal/Alertness: Awake/alert Behavior During Therapy: WFL for tasks assessed/performed Overall Cognitive Status: Within Functional Limits for tasks assessed    Balance  Balance Balance Assessed: Yes Dynamic Standing Balance Dynamic Standing - Balance Support: No upper extremity supported;During functional activity Dynamic Standing - Level of Assistance: 4: Min assist  End of Session PT - End of Session Equipment Utilized During Treatment: Gait belt;Left knee immobilizer Activity Tolerance: Patient tolerated treatment well Patient left: in bed;with call bell/phone within reach;with family/visitor present Nurse Communication: Mobility status CPM Left Knee CPM Left Knee: Off   GP     Ferman Hamming 05/02/2013, 3:12 PM Weldon Picking PT Acute Rehab Services [phone] Bee[place] [phone]

## 2013-05-02 NOTE — Progress Notes (Signed)
UR COMPLETED  

## 2013-05-02 NOTE — Clinical Social Work Placement (Signed)
Clinical Social Work Department  CLINICAL SOCIAL WORK PLACEMENT NOTE   Patient: Priscilla Brooks Account Number: [id number] Admit date: 05/01/13 Clinical Social Worker: Sabino Niemann LCSWA Date/time: 05/01/13 11:30 Clinical Social Work is seeking post-discharge placement for this patient at the following level of care: SKILLED NURSING (*CSW will update this form in Epic as items are completed)   Patient/family provided with [facility] Department of Clinical Social Work's list of facilities offering this level of care within the geographic area requested by the patient (or if unable, by the patient's family).  05/01/13  Patient/family informed of their freedom to choose among providers that offer the needed level of care, that participate in Medicare, Medicaid or managed care program needed by the patient, have an available bed and are willing to accept the patient.  05/01/13  Patient/family informed of [facility]' ownership interest in [facility], as well as of the fact that they are under no obligation to receive care at this facility.  PASARR submitted to EDS on  Pre-exisitng PASARR number received from EDS on  FL2 transmitted to all facilities in geographic area requested by pt/family on 05/01/13 FL2 transmitted to all facilities within larger geographic area on  Patient informed that his/her managed care company has contracts with or will negotiate with certain facilities, including the following:  Patient/family informed of bed offers received: 05/01/13 Patient chooses bed at [facility] Physician recommends and patient chooses bed at  Patient to be transferred to on  Patient to be transferred to facility by  The following physician request were entered in Epic:  Additional Comments:

## 2013-05-02 NOTE — Evaluation (Signed)
Physical Therapy Evaluation Patient Details Name: Priscilla Brooks MRN: [id number] DOB: 20-Oct-1950 Today's Date: 05/02/2013 Time: 4696-2952 PT Time Calculation (min): 35 min  PT Assessment / Plan / Recommendation History of Present Illness  Pt with L TKA  Clinical Impression  This patient underwent a left TKA and presents to PT with anticipated post-op decrease in strength and ROM, decreased functional mobility and gait.  Pt. Will benefit from acute PT to address these and below issues.  She progressed well first time up however she has weak L quad as of yet.  Good knee flexion seated a t edge of bed (70), likely still lingering effects of nerve block.     PT Assessment  Patient needs continued PT services    Follow Up Recommendations  SNF;Supervision/Assistance - 24 hour;Supervision for mobility/OOB    Does the patient have the potential to tolerate intense rehabilitation      Barriers to Discharge Decreased caregiver support      Equipment Recommendations  Rolling walker with 5" wheels    Recommendations for Other Services     Frequency 7X/week    Precautions / Restrictions Precautions Precautions: Knee Precaution Booklet Issued: Yes (comment) Precaution Comments: reviewed precautions Required Braces or Orthoses: Knee Immobilizer - Right Restrictions Weight Bearing Restrictions: Yes LLE Weight Bearing: Weight bearing as tolerated   Pertinent Vitals/Pain See vitals tab       Mobility  Bed Mobility Bed Mobility: Not assessed Supine to Sit: 4: Min assist;HOB flat Sitting - Scoot to Edge of Bed: 5: Supervision Details for Bed Mobility Assistance: pt up in recliner  Transfers Transfers: Sit to Stand;Stand to Sit Sit to Stand: 4: Min assist;From chair/3-in-1;With upper extremity assist Stand to Sit: 4: Min assist;To chair/3-in-1 Details for Transfer Assistance: cues for safe technique and hand placement Ambulation/Gait Ambulation/Gait Assistance: 4: Min  assist;Other (comment) (second person followed with chair) Ambulation Distance (Feet): 40 Feet Assistive device: Rolling walker Ambulation/Gait Assistance Details: cue for correct sequencing and technique, needed min assist for safety and stability; verbal reinforcement for sequence was needed frequently Gait Pattern: Step-to pattern;Decreased hip/knee flexion - left;Decreased stance time - left Gait velocity: decreased Stairs: No    Exercises Total Joint Exercises Ankle Circles/Pumps: AROM;Both;10 reps;Supine Quad Sets: AROM;Both;10 reps;Supine Knee Flexion: AAROM;Left;Seated Goniometric ROM: 0-70   PT Diagnosis: Difficulty walking;Abnormality of gait  PT Problem List: Decreased strength;Decreased range of motion;Decreased activity tolerance;Decreased mobility;Decreased knowledge of use of DME;Decreased knowledge of precautions PT Treatment Interventions: DME instruction;Gait training;Functional mobility training;Therapeutic activities;Therapeutic exercise;Patient/family education     PT Goals(Current goals can be found in the care plan section) Acute Rehab PT Goals Patient Stated Goal: To go home after rehab at SNF PT Goal Formulation: With patient Time For Goal Achievement: 05/09/13 Potential to Achieve Goals: Good  Visit Information  Last PT Received On: 05/02/13 Assistance Needed: +1 History of Present Illness: Pt with L TKA       Prior Functioning  Home Living Family/patient expects to be discharged to:: Private residence Living Arrangements: Alone Available Help at Discharge: Available 24 hours/day Type of Home: House Home Access: Stairs to enter Secretary/administrator of Steps: 2 Entrance Stairs-Rails: None Home Layout: Two level Alternate Level Stairs-Number of Steps: 12 Alternate Level Stairs-Rails: Right;Left;None Home Equipment: None Prior Function Level of Independence: Independent Communication Communication: No difficulties Dominant Hand: Right     Cognition  Cognition Arousal/Alertness: Awake/alert Behavior During Therapy: WFL for tasks assessed/performed Overall Cognitive Status: Within Functional Limits for tasks assessed    Extremity/Trunk Assessment  Upper Extremity Assessment Upper Extremity Assessment: Overall WFL for tasks assessed Lower Extremity Assessment Lower Extremity Assessment: LLE deficits/detail LLE Deficits / Details: good ankle pump; poor quad set LLE Sensation:  (intact to light touch, denies numbness) Cervical / Trunk Assessment Cervical / Trunk Assessment: Normal   Balance Balance Balance Assessed: Yes Dynamic Standing Balance Dynamic Standing - Balance Support: No upper extremity supported;During functional activity Dynamic Standing - Level of Assistance: 4: Min assist  End of Session PT - End of Session Equipment Utilized During Treatment: Gait belt;Left knee immobilizer;Other (comment) (to control her buckling in stance phase) Activity Tolerance: Patient tolerated treatment well Patient left: in chair;with call bell/phone within reach Nurse Communication: Mobility status CPM Left Knee CPM Left Knee: Off  GP     Ferman Hamming 05/02/2013, 1:31 PM Weldon Picking PT Acute Rehab Services [phone] Beeper [phone]

## 2013-05-02 NOTE — Evaluation (Signed)
Occupational Therapy Evaluation Patient Details Name: Priscilla Brooks MRN: [id number] DOB: 17-Mar-1951 Today's Date: 05/02/2013 Time: 9518-8416 OT Time Calculation (min): 19 min  OT Assessment / Plan / Recommendation History of present illness Pt with L TKA   Clinical Impression   Pt demos decline in function with ADLs and ADL mobility safety and would benefit from acute OT services to address impairments to increase level of function and safety     OT Assessment  Patient needs continued OT Services    Follow Up Recommendations  SNF;Supervision/Assistance - 24 hour    Barriers to Discharge Decreased caregiver support pt lives at home laone and plans to d/c to SNF [facility] )  Equipment Recommendations  None recommended by OT;Other (comment) (TBD at SNF level of care)    Recommendations for Other Services    Frequency  Min 2X/week    Precautions / Restrictions Precautions Precautions: Knee Precaution Booklet Issued: Yes (comment) Precaution Comments: reviewed precautions Required Braces or Orthoses: Knee Immobilizer - Right Restrictions Weight Bearing Restrictions: Yes LLE Weight Bearing: Weight bearing as tolerated   Pertinent Vitals/Pain 8/10    ADL  Grooming: Performed;Wash/dry hands;Wash/dry face;Teeth care;Min guard;Set up Upper Body Bathing: Simulated;Supervision/safety;Set up Lower Body Bathing: Simulated;Maximal assistance Upper Body Dressing: Performed;Supervision/safety;Set up Lower Body Dressing: Performed;Maximal assistance Toilet Transfer: Performed;Minimal assistance Toilet Transfer Equipment: Grab bars;Raised toilet seat with arms (or 3-in-1 over toilet) Toileting - Clothing Manipulation and Hygiene: Performed;Moderate assistance Where Assessed - Toileting Clothing Manipulation and Hygiene: Standing Tub/Shower Transfer Method: Not assessed Equipment Used: Rolling walker (3 in 1)    OT Diagnosis: Generalized weakness;Acute pain  OT Problem List:  Decreased strength;Decreased knowledge of use of DME or AE;Decreased activity tolerance;Impaired balance (sitting and/or standing);Pain OT Treatment Interventions: Self-care/ADL training;Therapeutic exercise;Patient/family education;Neuromuscular education;Balance training;Therapeutic activities;DME and/or AE instruction   OT Goals(Current goals can be found in the care plan section) Acute Rehab OT Goals Patient Stated Goal: To go home after rehab at SNF OT Goal Formulation: With patient Time For Goal Achievement: 05/09/13 Potential to Achieve Goals: Good ADL Goals Pt Will Perform Grooming: with set-up;with supervision;standing Pt Will Perform Lower Body Bathing: with mod assist;with adaptive equipment Pt Will Perform Lower Body Dressing: with mod assist;with adaptive equipment Pt Will Transfer to Toilet: with min guard assist;grab bars (3 in 1 over toilet) Pt Will Perform Toileting - Clothing Manipulation and hygiene: with min assist;with min guard assist;sit to/from stand  Visit Information  Last OT Received On: 05/02/13 Assistance Needed: +1 History of Present Illness: Pt with L TKA       Prior Functioning     Home Living Family/patient expects to be discharged to:: Private residence Living Arrangements: Alone Available Help at Discharge: Available 24 hours/day Type of Home: House Home Access: Stairs to enter [employer] of Steps: 2 Entrance Stairs-Rails: None Home Layout: Two level Alternate Level Stairs-Number of Steps: 12 Alternate Level Stairs-Rails: Right;Left;None Home Equipment: None Prior Function Level of Independence: Independent Communication Communication: No difficulties Dominant Hand: Right         Vision/Perception Vision - History Baseline Vision: Wears glasses all the time Patient Visual Report: No change from baseline Perception Perception: Within Functional Limits   Cognition  Cognition Arousal/Alertness: Awake/alert Behavior  During Therapy: WFL for tasks assessed/performed Overall Cognitive Status: Within Functional Limits for tasks assessed    Extremity/Trunk Assessment Upper Extremity Assessment Upper Extremity Assessment: Overall WFL for tasks assessed     Mobility Bed Mobility Bed Mobility: Not assessed Details for Bed Mobility  Assistance: pt up in recliner  Transfers Transfers: Sit to Stand;Stand to Sit Sit to Stand: 4: Min assist;From chair/3-in-1;With upper extremity assist Stand to Sit: 4: Min assist;To chair/3-in-1     Exercise     Balance Balance Balance Assessed: Yes Dynamic Standing Balance Dynamic Standing - Balance Support: No upper extremity supported;During functional activity Dynamic Standing - Level of Assistance: 4: Min assist   End of Session OT - End of Session Equipment Utilized During Treatment: Gait belt;Rolling walker;Other (comment) (3 in 1 ober toilet) Activity Tolerance: Patient tolerated treatment well Patient left: in chair;with call bell/phone within reach CPM Left Knee CPM Left Knee: Off  GO     [name] [facility] 05/02/2013, 12:43 PM

## 2013-05-02 NOTE — Progress Notes (Signed)
Subjective: 1 Day Post-Op Procedure(s) (LRB): TOTAL KNEE ARTHROPLASTY (Left) Doing well with minimal pain. Activity level:  ok can be weightbearing as tolerated left side. Diet tolerance:  ok Voiding:  ok Patient reports pain as 2 on 0-10 scale.    Objective: Vital signs in last 24 hours: Temp:  [97.6 F (36.4 C)-98.3 F (36.8 C)] 97.6 F (36.4 C) (10/03 0507) Pulse Rate:  [65-90] 75 (10/03 0507) Resp:  [11-20] 18 (10/03 0507) BP: (103-135)/(53-76) 117/53 mmHg (10/03 0507) SpO2:  [97 %-100 %] 100 % (10/03 0507)  Labs:  Recent Labs  05/02/13 0530  HGB 9.0*    Recent Labs  05/02/13 0530  WBC 10.2  RBC 3.16*  HCT 27.0*  PLT 225    Recent Labs  05/02/13 0530  NA 137  K 4.3  CL 103  CO2 27  BUN 11  CREATININE 0.79  GLUCOSE 100*  CALCIUM 8.9   No results found for this basename: LABPT, INR,  in the last 72 hours  Physical Exam:  Neurologically intact ABD soft Neurovascular intact Sensation intact distally Intact pulses distally Dorsiflexion/Plantar flexion intact Incision: dressing C/D/I No cellulitis present Compartment soft  Assessment/Plan:  1 Day Post-Op Procedure(s) (LRB): TOTAL KNEE ARTHROPLASTY (Left) Advance diet Up with therapy D/C IV fluids Discharge to SNF Saturday or Sunday if doing well ASA 325 twice a day/SCDs for DVT prophylaxis May be weightbearing as tolerated with therapy. Use incentive spirometer. Pain controlled well.    Brick Ketcher R 05/02/2013, 8:15 AM

## 2013-05-02 NOTE — Clinical Social Work Psychosocial (Signed)
Clinical Social Work Department  BRIEF PSYCHOSOCIAL ASSESSMENT  Patient: Priscilla Brooks  Account Number: [id number]  Admit date: 05/01/13 Clinical Social Worker Sabino Niemann, MSW Date/Time: 05/01/13 Referred by: Physician Date Referred: 04/30/13 Referred for   SNF Placement   Other Referral:  Interview type: Patient  Other interview type: PSYCHOSOCIAL DATA  Living Status: Alone Admitted from facility:  Level of care:  Primary support name: Aggie Moats Primary support relationship to patient: Brother Degree of support available:  Fair  CURRENT CONCERNS  Current Concerns   Post-Acute Placement   Other Concerns:  SOCIAL WORK ASSESSMENT / PLAN  CSW met with pt re: PT recommendation for SNF.   Pt lives alone  CSW explained placement process and answered questions.   Pt reports [facility]  as her preference    CSW completed FL2 and initiated SNF search.     Assessment/plan status: Information/Referral to [facility]  Other assessment/ plan:  Information/referral to community resources:  SNF   PTAR  PATIENT'S/FAMILY'S RESPONSE TO PLAN OF CARE:  Pt  reports she is agreeable to [facility] SN[facility] in order to increase strength and independence with mobility prior to returning home  Pt verbalized understanding of placement process and appreciation for CSW assist.   Sabino Niemann, MSW [phone]

## 2013-05-03 DIAGNOSIS — D62 Acute posthemorrhagic anemia: Secondary | ICD-10-CM | POA: Insufficient documentation

## 2013-05-03 LAB — GLUCOSE, CAPILLARY
Glucose-Capillary: 93 mg/dL (ref 70–99)
Glucose-Capillary: 86 mg/dL (ref 70–99)
Glucose-Capillary: 107 mg/dL — ABNORMAL HIGH (ref 70–99)

## 2013-05-03 LAB — CBC
HCT: 24.2 % — ABNORMAL LOW (ref 36.0–46.0)
Hemoglobin: 8.2 g/dL — ABNORMAL LOW (ref 12.0–15.0)
MCH: 28.7 pg (ref 26.0–34.0)
MCHC: 33.9 g/dL (ref 30.0–36.0)
MCV: 84.6 fL (ref 78.0–100.0)
Platelets: 208 K/uL (ref 150–400)
RBC: 2.86 MIL/uL — ABNORMAL LOW (ref 3.87–5.11)
RDW: 14.3 % (ref 11.5–15.5)
WBC: 9.1 K/uL (ref 4.0–10.5)

## 2013-05-03 LAB — PREPARE RBC (CROSSMATCH)

## 2013-05-03 NOTE — Care Management Note (Signed)
    Page 1 of 1   05/03/2013     3:43:05 PM   CARE MANAGEMENT NOTE 05/03/2013  Patient:  Priscilla Brooks, Priscilla Brooks   Account Number:  [id number]  Date Initiated:  05/02/2013  Documentation initiated by:  [facility]  Subjective/Objective Assessment:   admitted postop left total knee arthroplasty     Action/Plan:   Pt/Ot evals- recommended SNF   Anticipated DC Date:  05/04/2013   Anticipated DC Plan:  SKILLED NURSING FACILITY      DC Planning Services  CM consult      Choice offered to / List presented to:             Status of service:  Completed, signed off Medicare Important Message given?   (If response is "NO", the following Medicare IM given date fields will be blank) Date Medicare IM given:   Date Additional Medicare IM given:    Discharge Disposition:  SKILLED NURSING FACILITY  Per UR Regulation:    If discussed at Long Length of Stay Meetings, dates discussed:    Comments:  05/03/13 15:35 Pt to be discharged to [facility].  Please see CSW note for details.  Freddy Jaksch, BSN, CM, [phone].

## 2013-05-03 NOTE — Progress Notes (Signed)
Physical Therapy Treatment Patient Details Name: Priscilla Brooks MRN: [id number] DOB: [birth year] Today's Date: 05/03/2013 Time: 4098-1191 PT Time Calculation (min): 23 min  PT Assessment / Plan / Recommendation  History of Present Illness Pt with L TKA   PT Comments   Pt. Making good progress in PT.  She is beginning to regain strength in L quad today and could do SAQ actively by end of exercise session.  Did use the KI today, but she will likely not need it tomorrow as long as she continues to make gains.  Pt. To receive 2 units of blood following her PT session.  Hgb had dropped to 8.2.  Will continue tomorrow.  Follow Up Recommendations  SNF;Supervision/Assistance - 24 hour;Supervision for mobility/OOB     Does the patient have the potential to tolerate intense rehabilitation     Barriers to Discharge        Equipment Recommendations  Rolling walker with 5" wheels    Recommendations for Other Services    Frequency 7X/week   Progress towards PT Goals Progress towards PT goals: Progressing toward goals  Plan Current plan remains appropriate    Precautions / Restrictions Precautions Precautions: Knee Required Braces or Orthoses: Knee Immobilizer - Left Restrictions Weight Bearing Restrictions: Yes LLE Weight Bearing: Weight bearing as tolerated   Pertinent Vitals/Pain See vitals tab     Mobility  Bed Mobility Bed Mobility: Not assessed (pt. up in recliner) Transfers Transfers: Sit to Stand;Stand to Sit Sit to Stand: 4: Min guard;From chair/3-in-1;With upper extremity assist;With armrests Stand to Sit: 4: Min guard;To chair/3-in-1;To bed;With upper extremity assist;With armrests Details for Transfer Assistance: reinforced safe technique and hnad placement Ambulation/Gait Ambulation/Gait Assistance: 4: Min assist Ambulation Distance (Feet): 80 Feet Assistive device: Rolling walker Ambulation/Gait Assistance Details: min assist for safety and stability; sequencing  improving; needed cues for step length into RW for improved/safer balance Gait Pattern: Step-to pattern;Decreased hip/knee flexion - left;Decreased stance time - left Gait velocity: decreased General Gait Details: Began to work toward step through pattern.  Pt. able to use this technique 2 or 3 steps at a atime then returned to step to patterning Stairs: No    Exercises Total Joint Exercises Ankle Circles/Pumps: AROM;Both;10 reps;Seated Quad Sets: AROM;Both;10 reps;Seated Gluteal Sets: AROM;Both;10 reps;Seated Short Arc Quad: AAROM;AROM;Left;10 reps;Seated Hip ABduction/ADduction: AROM;Left;10 reps;Seated Knee Flexion: AAROM;AROM;Left;Seated Goniometric ROM: 0-80   PT Diagnosis:    PT Problem List:   PT Treatment Interventions:     PT Goals (current goals can now be found in the care plan section) Acute Rehab PT Goals Patient Stated Goal: To go home after rehab at SNF  Visit Information  Last PT Received On: 05/03/13 Assistance Needed: +1 History of Present Illness: Pt with L TKA    Subjective Data  Subjective: I feel a little dizzy headed Patient Stated Goal: To go home after rehab at [facility]   Cognition  Cognition Arousal/Alertness: Awake/alert Behavior During Therapy: WFL for tasks assessed/performed Overall Cognitive Status: Within Functional Limits for tasks assessed    Balance     End of Session PT - End of Session Equipment Utilized During Treatment: Gait belt;Left knee immobilizer Activity Tolerance: Patient tolerated treatment well Patient left: in chair;with call bell/phone within reach;with nursing/sitter in room Nurse Communication: Mobility status   GP     Ferman Hamming 05/03/2013, 2:53 PM Weldon Picking PT Acute Rehab Services [phone] Beeper [phone]

## 2013-05-03 NOTE — Progress Notes (Signed)
Subjective: 2 Days Post-Op Procedure(s) (LRB): TOTAL KNEE ARTHROPLASTY (Left) Hemoglobin 8.2 and patient feels dizzy and blood pressure has been decreased as well. We'll plan to give 2 units of packed RBCs today. Hopeful for discharge to accept patients on Sunday.to skilled nursing if Activity level:  Weight bearing as tolerated Diet tolerance:   eating  Voiding:   okay Patient reports pain as 1 on 0-10 scale.    Objective: Vital signs in last 24 hours: Temp:  [97.6 F (36.4 C)-98.5 F (36.9 C)] 98.5 F (36.9 C) (10/04 0642) Pulse Rate:  [57-74] 58 (10/04 0642) Resp:  [16-19] 19 (10/04 0642) BP: (103-121)/(56-68) 121/68 mmHg (10/04 0642) SpO2:  [98 %-100 %] 99 % (10/04 0642)  Labs:  Recent Labs  05/02/13 0530 05/03/13 0610  HGB 9.0* 8.2*    Recent Labs  05/02/13 0530 05/03/13 0610  WBC 10.2 9.1  RBC 3.16* 2.86*  HCT 27.0* 24.2*  PLT 225 208    Recent Labs  05/02/13 0530  NA 137  K 4.3  CL 103  CO2 27  BUN 11  CREATININE 0.79  GLUCOSE 100*  CALCIUM 8.9   No results found for this basename: LABPT, INR,  in the last 72 hours  Physical Exam:  Neurologically intact ABD soft Neurovascular intact Sensation intact distally Intact pulses distally Dorsiflexion/Plantar flexion intact No cellulitis present Compartment soft  Assessment/Plan:  2 Days Post-Op Procedure(s) (LRB): TOTAL KNEE ARTHROPLASTY (Left) Advance diet Up with therapy D/C IV fluids Plan for discharge tomorrow Discharge to SNF Sunday if they take patient's. Due to dizziness and decreased blood pressure will transfuse 2 units of packed RBCs today. Continue ASA 325 and SCDs for DVT prophylaxis.    Priscilla Brooks R 05/03/2013, 8:25 AM

## 2013-05-04 LAB — GLUCOSE, CAPILLARY: Glucose-Capillary: 113 mg/dL — ABNORMAL HIGH (ref 70–99)

## 2013-05-04 LAB — TYPE AND SCREEN
ABO/RH(D): A POS
Antibody Screen: NEGATIVE
Unit division: 0

## 2013-05-04 LAB — CBC
Hemoglobin: 11.1 g/dL — ABNORMAL LOW (ref 12.0–15.0)
MCH: 28.4 pg (ref 26.0–34.0)
MCHC: 33.4 g/dL (ref 30.0–36.0)
MCV: 84.9 fL (ref 78.0–100.0)
Platelets: 227 10*3/uL (ref 150–400)
RBC: 3.91 MIL/uL (ref 3.87–5.11)
RDW: 14.2 % (ref 11.5–15.5)

## 2013-05-04 MED ORDER — ASPIRIN 325 MG PO TBEC: 325.0000 mg | DELAYED_RELEASE_TABLET | Freq: Two times a day (BID) | ORAL | Status: AC

## 2013-05-04 MED ORDER — POLYETHYLENE GLYCOL 3350 17 G PO PACK: 17.0000 g | PACK | Freq: Two times a day (BID) | ORAL | Status: DC | PRN

## 2013-05-04 MED ORDER — METHOCARBAMOL 500 MG PO TABS: 500.0000 mg | ORAL_TABLET | Freq: Four times a day (QID) | ORAL | Status: DC | PRN

## 2013-05-04 MED ORDER — HYDROCODONE-ACETAMINOPHEN 7.5-325 MG PO TABS: 1.0000 | ORAL_TABLET | ORAL | Status: DC | PRN

## 2013-05-04 MED ORDER — BISACODYL 5 MG PO TBEC
10.0000 mg | DELAYED_RELEASE_TABLET | Freq: Every day | ORAL | Status: DC | PRN
  Administered 2013-05-04: 10 mg via ORAL
  Filled 2013-05-04: qty 2

## 2013-05-04 NOTE — Discharge Summary (Signed)
Patient ID: Priscilla Brooks MRN: [id number] DOB/AGE: [birth year] 62 y.o.  Admit date: 05/01/2013 Discharge date: 05/05/2013  Admission Diagnoses:  Principal Problem:   Left knee DJD Active Problems:   Postoperative anemia due to acute blood loss   Discharge Diagnoses:  Same  Past Medical History  Diagnosis Date  . Complication of anesthesia     wakes slowly  . PONV (postoperative nausea and vomiting)   . Hypertension   . Diabetes mellitus without complication   . GERD (gastroesophageal reflux disease)   . Arthritis     L knees, low back   . Anemia     told that she needs iron daily    Surgeries: Procedure(s): TOTAL KNEE ARTHROPLASTY on 05/01/2013   Consultants:    Discharged Condition: Improved  Hospital Course: Priscilla Brooks is an 62 y.o. female who was admitted 05/01/2013 for operative treatment ofLeft knee DJD. Patient has severe unremitting pain that affects sleep, daily activities, and work/hobbies. After pre-op clearance the patient was taken to the operating room on 05/01/2013 and underwent  Procedure(s): TOTAL KNEE ARTHROPLASTY.    Patient was given perioperative antibiotics: Anti-infectives   Start     Dose/Rate Route Frequency Ordered Stop   05/01/13 1730  ceFAZolin (ANCEF) IVPB 2 g/50 mL premix     2 g 100 mL/hr over 30 Minutes Intravenous Every 6 hours 05/01/13 1621 05/01/13 2323   05/01/13 0600  ceFAZolin (ANCEF) 3 g in dextrose 5 % 50 mL IVPB     3 g 160 mL/hr over 30 Minutes Intravenous On call to O.R. 04/30/13 1329 05/01/13 0859       Patient was given sequential compression devices, early ambulation, and chemoprophylaxis to prevent DVT. Hemoglobin did drop and patient was symptomatic from blood loss anemia and 2 units of packed RBCs were transfused  Patient benefited maximally from hospital stay and there were no complications.    Recent vital signs: Patient Vitals for the past 24 hrs:  BP Temp Temp src Pulse Resp SpO2  05/04/13 0604 115/58 mmHg  98.4 F (36.9 C) Oral 84 16 96 %  05/03/13 2210 111/66 mmHg 98.5 F (36.9 C) Oral 80 16 100 %  05/03/13 1701 108/69 mmHg 99 F (37.2 C) Oral 84 16 100 %  05/03/13 1649 100/61 mmHg 98.1 F (36.7 C) Oral 78 18 100 %  05/03/13 1539 97/50 mmHg 97.8 F (36.6 C) Oral 83 18 99 %  05/03/13 1439 106/60 mmHg 98.4 F (36.9 C) Oral 77 18 100 %  05/03/13 1424 112/57 mmHg 98.7 F (37.1 C) Oral 79 18 100 %  05/03/13 1309 93/47 mmHg 98.4 F (36.9 C) Oral 77 16 100 %  05/03/13 1249 93/43 mmHg 98.4 F (36.9 C) Oral 77 16 100 %  05/03/13 1149 98/47 mmHg 98.9 F (37.2 C) Oral 77 16 100 %     Recent laboratory studies:  Recent Labs  05/02/13 0530 05/03/13 0610 05/04/13 0710  WBC 10.2 9.1 9.8  HGB 9.0* 8.2* 11.1*  HCT 27.0* 24.2* 33.2*  PLT 225 208 227  NA 137  --   --   K 4.3  --   --   CL 103  --   --   CO2 27  --   --   BUN 11  --   --   CREATININE 0.79  --   --   GLUCOSE 100*  --   --   CALCIUM 8.9  --   --      Discharge Medications:  Medication List    STOP taking these medications       aspirin 81 MG tablet  Replaced by:  aspirin 325 MG EC tablet     meloxicam 15 MG tablet  Commonly known as:  MOBIC      TAKE these medications       aspirin 325 MG EC tablet  Take 1 tablet (325 mg total) by mouth 2 (two) times daily.     atorvastatin 10 MG tablet  Commonly known as:  LIPITOR  Take 10 mg by mouth at bedtime.     calcium carbonate 600 MG Tabs tablet  Commonly known as:  OS-CAL  Take 600 mg by mouth 2 (two) times daily with a meal.     esomeprazole 40 MG capsule  Commonly known as:  NEXIUM  Take 40 mg by mouth daily before breakfast.     ferrous sulfate 325 (65 FE) MG tablet  Take 325 mg by mouth daily with breakfast.     fexofenadine 180 MG tablet  Commonly known as:  ALLEGRA  Take 180 mg by mouth daily with breakfast.     HYDROcodone-acetaminophen 7.5-325 MG per tablet  Commonly known as:  NORCO  Take 1-2 tablets by mouth every 4 (four) hours as  needed.     lisinopril-hydrochlorothiazide 20-12.5 MG per tablet  Commonly known as:  PRINZIDE,ZESTORETIC  Take 1 tablet by mouth daily with breakfast.     methocarbamol 500 MG tablet  Commonly known as:  ROBAXIN  Take 1 tablet (500 mg total) by mouth every 6 (six) hours as needed.     multivitamin tablet  Take 1 tablet by mouth daily.     pioglitazone 15 MG tablet  Commonly known as:  ACTOS  Take 15 mg by mouth daily with breakfast.     Vitamin D 2000 UNITS tablet  Take 2,000 Units by mouth daily.       may be weightbearing as tolerated. CPM machine 0-70   6 hours a day and advance as tolerated. Continue ASA 325 1 pill twice a day for 2 weeks postop. May change dressing as needed. Return to Dr. Jerl Santos office in 2 weeks.  Diagnostic Studies: Dg Chest 2 View  04/28/2013   *RADIOLOGY REPORT*  Clinical Data: Preoperative respiratory examination for knee replacement.  CHEST - 2 VIEW  Comparison: None  Findings: The cardiomediastinal silhouette is unremarkable. The lungs are clear. There is no evidence of focal airspace disease, pulmonary edema, suspicious pulmonary nodule/mass, pleural effusion, or pneumothorax. No acute bony abnormalities are identified.  IMPRESSION: No evidence of active cardiopulmonary disease.   Original Report Authenticated By: Harmon Pier, M.D.    Disposition: Final discharge disposition not confirmed      Discharge Orders   Future Orders Complete By Expires   Call MD / Call 911  As directed    Comments:     If you experience chest pain or shortness of breath, CALL 911 and be transported to the hospital emergency room.  If you develope a fever above 101 F, pus (white drainage) or increased drainage or redness at the wound, or calf pain, call your surgeon's office.   Constipation Prevention  As directed    Comments:     Drink plenty of fluids.  Prune juice may be helpful.  You may use a stool softener, such as Colace (over the counter) 100 mg twice a day.  Use  MiraLax (over the counter) for constipation as needed.   Diet - low  sodium heart healthy  As directed    Increase activity slowly as tolerated  As directed       Follow-up Information   Follow up with DALLDORF,PETER G, MD. Call in 2 weeks.   Specialty:  Orthopedic Surgery   Contact information:   [address] [place] [address] [phone]        Signed: Prince Rome 05/04/2013, 11:46 AM

## 2013-05-04 NOTE — Progress Notes (Signed)
Physical Therapy Treatment Patient Details Name: Priscilla Brooks MRN: [id number] DOB: [birth year] Today's Date: 05/04/2013 Time: 1212-1230 PT Time Calculation (min): 18 min  PT Assessment / Plan / Recommendation  History of Present Illness Pt with L TKA   PT Comments   Pt continues to make progress with mobility.  Follow Up Recommendations  SNF;Supervision/Assistance - 24 hour;Supervision for mobility/OOB     Equipment Recommendations  Rolling walker with 5" wheels       Frequency 7X/week   Progress towards PT Goals Progress towards PT goals: Progressing toward goals  Plan Current plan remains appropriate    Precautions / Restrictions Precautions Precautions: Knee Restrictions LLE Weight Bearing: Weight bearing as tolerated       Mobility  Bed Mobility Bed Mobility: Not assessed Details for Bed Mobility Assistance: pt in recliner before and after session today Transfers Sit to Stand: 5: Supervision;From chair/3-in-1;With upper extremity assist;With armrests Stand to Sit: 5: Supervision;To chair/3-in-1;With upper extremity assist;With armrests Details for Transfer Assistance: reinforced safe technique and hnad placement Ambulation/Gait Ambulation/Gait Assistance: 5: Supervision Ambulation Distance (Feet): 100 Feet Assistive device: Rolling walker Ambulation/Gait Assistance Details: with cues pt able to progress from step to pattern to step through pattern. min cues for posture and walker position with gait. Gait Pattern: Step-to pattern;Step-through pattern;Antalgic;Trunk flexed Gait velocity: decreased    Exercises Total Joint Exercises Ankle Circles/Pumps: AROM;Both;10 reps;Seated Quad Sets: AROM;Strengthening;Left;10 reps;Seated Hip ABduction/ADduction: AAROM;Strengthening;Left;10 reps;Seated Straight Leg Raises: AAROM;Strengthening;Left;10 reps;Seated Long Arc Quad: AROM;Strengthening;Left;10 reps;Seated Knee Flexion: AROM;Strengthening;Left;10  reps;Seated Goniometric ROM: seated left knee AAROM: 80 degrees flexion without compensation, left hip hike noted with greater than 80 degrees     PT Goals (current goals can now be found in the care plan section) Acute Rehab PT Goals Patient Stated Goal: To go home after rehab at SNF PT Goal Formulation: With patient Time For Goal Achievement: 05/09/13 Potential to Achieve Goals: Good  Visit Information  Last PT Received On: 05/04/13 Assistance Needed: +1 History of Present Illness: Pt with L TKA    Subjective Data  Patient Stated Goal: To go home after rehab at [facility]   Cognition  Cognition Arousal/Alertness: Awake/alert Behavior During Therapy: [facility] for tasks assessed/performed Overall Cognitive Status: Within Functional Limits for tasks assessed       End of Session PT - End of Session Equipment Utilized During Treatment: Gait belt Activity Tolerance: Patient tolerated treatment well Patient left: in chair;with call bell/phone within reach Nurse Communication: Mobility status   GP     Sallyanne Kuster 05/04/2013, 2:25 PM  Sallyanne Kuster, PTA Office- [phone]

## 2013-05-04 NOTE — Progress Notes (Signed)
Subjective: 3 Days Post-Op Procedure(s) (LRB): TOTAL KNEE ARTHROPLASTY (Left) She is feeling great and ready to go to skilled nursing facility today.  Activity level:  Weightbearing as tolerated left Diet tolerance:  Eating Voiding:  Voiding okay Patient reports pain as 1 on 0-10 scale.    Objective: Vital signs in last 24 hours: Temp:  [97.8 F (36.6 C)-99 F (37.2 C)] 98.4 F (36.9 C) (10/05 0604) Pulse Rate:  [77-84] 84 (10/05 0604) Resp:  [16-18] 16 (10/05 0604) BP: (93-115)/(43-69) 115/58 mmHg (10/05 0604) SpO2:  [96 %-100 %] 96 % (10/05 0604)  Labs:  Recent Labs  05/02/13 0530 05/03/13 0610 05/04/13 0710  HGB 9.0* 8.2* 11.1*    Recent Labs  05/03/13 0610 05/04/13 0710  WBC 9.1 9.8  RBC 2.86* 3.91  HCT 24.2* 33.2*  PLT 208 227    Recent Labs  05/02/13 0530  NA 137  K 4.3  CL 103  CO2 27  BUN 11  CREATININE 0.79  GLUCOSE 100*  CALCIUM 8.9   No results found for this basename: LABPT, INR,  in the last 72 hours  Physical Exam:  Neurologically intact ABD soft Neurovascular intact Sensation intact distally Intact pulses distally Dorsiflexion/Plantar flexion intact Incision: scant drainage No cellulitis present Compartment soft  Assessment/Plan:  3 Days Post-Op Procedure(s) (LRB): TOTAL KNEE ARTHROPLASTY (Left) Advance diet Up with therapy Discharge to SNF-[facility]  Continue ASA 325 1 pill twice a day x2 weeks. May be weightbearing as tolerated left. Prescriptions for pain and muscle relaxer on chart. FL-2 sign Return to office in 2 weeks.    Alise Calais R 05/04/2013, 11:32 AM

## 2013-05-04 NOTE — Progress Notes (Signed)
Clinical Child psychotherapist (CSW) received call from [employer] about patient going to [facility] today. CSW contacted [facility] and the supervisor reported that they cannot take weekend admissions unless it is set up on Friday. In [facility] [place] reported that they would have a bed available for the patient on Monday 05/05/13. Nursing is aware of above.   Jetta Lout, LCSWA Weekend CSW [phone]

## 2013-05-05 ENCOUNTER — Encounter (HOSPITAL_COMMUNITY): Payer: Self-pay | Admitting: General Practice

## 2013-05-05 LAB — GLUCOSE, CAPILLARY: Glucose-Capillary: 89 mg/dL (ref 70–99)

## 2013-05-05 NOTE — Progress Notes (Signed)
Subjective: 4 Days Post-Op Procedure(s) (LRB): TOTAL KNEE ARTHROPLASTY (Left) snf today doing great Activity level:  wbat Diet tolerance:  ok Voiding:  ok Patient reports pain as 1 on 0-10 scale.    Objective: Vital signs in last 24 hours: Temp:  [98 F (36.7 C)-98.8 F (37.1 C)] 98.6 F (37 C) (10/06 0607) Pulse Rate:  [74-84] 74 (10/06 0607) Resp:  [14-16] 14 (10/06 0607) BP: (98-111)/(55-58) 98/56 mmHg (10/06 0607) SpO2:  [97 %-98 %] 97 % (10/06 0607)  Labs:  Recent Labs  05/03/13 0610 05/04/13 0710  HGB 8.2* 11.1*    Recent Labs  05/03/13 0610 05/04/13 0710  WBC 9.1 9.8  RBC 2.86* 3.91  HCT 24.2* 33.2*  PLT 208 227   No results found for this basename: NA, K, CL, CO2, BUN, CREATININE, GLUCOSE, CALCIUM,  in the last 72 hours No results found for this basename: LABPT, INR,  in the last 72 hours  Physical Exam:  Neurologically intact ABD soft Neurovascular intact Sensation intact distally Intact pulses distally Dorsiflexion/Plantar flexion intact No cellulitis present Compartment soft  Assessment/Plan:  4 Days Post-Op Procedure(s) (LRB): TOTAL KNEE ARTHROPLASTY (Left) Advance diet Up with therapy Discharge to SNF [facility] bid x 2 weeks  rto 2 weeks [facility] now    Kella Splinter R 05/05/2013, 9:34 AM

## 2013-05-06 ENCOUNTER — Non-Acute Institutional Stay (SKILLED_NURSING_FACILITY): Payer: 59 | Admitting: Adult Health

## 2013-05-06 DIAGNOSIS — M1710 Unilateral primary osteoarthritis, unspecified knee: Secondary | ICD-10-CM

## 2013-05-06 DIAGNOSIS — E119 Type 2 diabetes mellitus without complications: Secondary | ICD-10-CM

## 2013-05-06 DIAGNOSIS — K219 Gastro-esophageal reflux disease without esophagitis: Secondary | ICD-10-CM

## 2013-05-06 DIAGNOSIS — I10 Essential (primary) hypertension: Secondary | ICD-10-CM

## 2013-05-06 DIAGNOSIS — D62 Acute posthemorrhagic anemia: Secondary | ICD-10-CM

## 2013-05-06 NOTE — Progress Notes (Signed)
Patient ID: Priscilla Brooks, female   DOB: 1951-07-14, 62 y.o.   MRN: [id number]       PROGRESS NOTE  DATE: 05/06/2013  FACILITY: Nursing Home Location: [facility]  LEVEL OF CARE: SNF (31)  Acute Visit  CHIEF COMPLAINT:  Follow-up hospitalization  HISTORY OF PRESENT ILLNESS: This is a 62 year old female who has been admitted to [facility] on 05/05/13 from [facility] with DJD S/P left total knee arthroplasty. She has been admitted for a short-term rehabilitation.  REASSESSMENT OF ONGOING PROBLEM(S):   GERD: pt's GERD is stable.  Denies ongoing heartburn, abd. Pain, nausea or vomiting.  Currently on a PPI & tolerates it without any adverse reactions.  HTN: Pt 's HTN remains stable.  Denies CP, sob, DOE, pedal edema, headaches, dizziness or visual disturbances.  No complications from the medications currently being used.  Last BP : 105/68  ANEMIA: The anemia has been stable. The patient denies fatigue, melena or hematochezia. No complications from the medications currently being used. 10/14  hgb 11.1  PAST MEDICAL HISTORY : Reviewed.  No changes.  CURRENT MEDICATIONS: Reviewed per [facility]  REVIEW OF SYSTEMS:  GENERAL: no change in appetite, no fatigue, no weight changes, no fever, chills or weakness RESPIRATORY: no cough, SOB, DOE, wheezing, hemoptysis CARDIAC: no chest pain, edema or palpitations GI: no abdominal pain, diarrhea, constipation, heart burn, nausea or vomiting  PHYSICAL EXAMINATION  VS:  T 97       P 76      RR 20     BP 105/68         WT 267 (Lb)  GENERAL: no acute distress, normal body habitus EYES: conjunctivae normal, sclerae normal, normal eye lids NECK: supple, trachea midline, no neck masses, no thyroid tenderness, no thyromegaly LYMPHATICS: no LAN in the neck, no supraclavicular LAN RESPIRATORY: breathing is even & unlabored, BS CTAB CARDIAC: RRR, no murmur,no extra heart sounds, no edema GI: abdomen soft, normal BS, no masses, no  tenderness, no hepatomegaly, no splenomegaly PSYCHIATRIC: the patient is alert & oriented to person, affect & behavior appropriate  LABS/RADIOLOGY: 05/04/13 WBC 9.8 hemoglobin 11.1 hematocrit 33.2 05/02/13 sodium 137 potassium 4.3 BUN 11 glucose 100 creatinine 0.79 calcium 8.9   ASSESSMENT/PLAN:  DJD status post left total knee arthroplasty - for rehabilitation  Hypertension - well controlled  Acute blood loss anemia - continue ferrous sulfate  GERD - stable  Diabetes mellitus, type II - continue Actos    CPT CODE: 40981

## 2013-05-30 ENCOUNTER — Other Ambulatory Visit: Payer: Self-pay

## 2013-05-30 DIAGNOSIS — Z1231 Encounter for screening mammogram for malignant neoplasm of breast: Secondary | ICD-10-CM

## 2013-06-30 ENCOUNTER — Ambulatory Visit
Admission: RE | Admit: 2013-06-30 | Discharge: 2013-06-30 | Disposition: A | Discharge status: Home / Self Care | Payer: 59 | Source: Ambulatory Visit

## 2013-06-30 DIAGNOSIS — Z1231 Encounter for screening mammogram for malignant neoplasm of breast: Secondary | ICD-10-CM

## 2013-07-21 DIAGNOSIS — E119 Type 2 diabetes mellitus without complications: Secondary | ICD-10-CM | POA: Insufficient documentation

## 2015-08-05 ENCOUNTER — Other Ambulatory Visit: Payer: Self-pay

## 2015-08-05 DIAGNOSIS — Z1231 Encounter for screening mammogram for malignant neoplasm of breast: Secondary | ICD-10-CM

## 2015-08-09 ENCOUNTER — Ambulatory Visit
Admission: RE | Admit: 2015-08-09 | Discharge: 2015-08-09 | Disposition: A | Discharge status: Home / Self Care | Payer: Medicare Other | Source: Ambulatory Visit

## 2015-08-09 DIAGNOSIS — Z1231 Encounter for screening mammogram for malignant neoplasm of breast: Secondary | ICD-10-CM

## 2015-12-01 ENCOUNTER — Ambulatory Visit: Payer: Medicare Other | Admitting: Podiatry

## 2015-12-08 ENCOUNTER — Encounter: Payer: Self-pay | Admitting: Podiatry

## 2015-12-08 ENCOUNTER — Ambulatory Visit (INDEPENDENT_AMBULATORY_CARE_PROVIDER_SITE_OTHER): Payer: Medicare Other | Admitting: Podiatry

## 2015-12-08 ENCOUNTER — Ambulatory Visit (INDEPENDENT_AMBULATORY_CARE_PROVIDER_SITE_OTHER): Payer: Medicare Other

## 2015-12-08 VITALS — BP 129/71 | HR 79 | Resp 16 | Ht 66.5 in | Wt 276.0 lb

## 2015-12-08 DIAGNOSIS — L84 Corns and callosities: Secondary | ICD-10-CM

## 2015-12-08 DIAGNOSIS — M21619 Bunion of unspecified foot: Secondary | ICD-10-CM

## 2015-12-08 DIAGNOSIS — K219 Gastro-esophageal reflux disease without esophagitis: Secondary | ICD-10-CM | POA: Insufficient documentation

## 2015-12-08 DIAGNOSIS — M79672 Pain in left foot: Secondary | ICD-10-CM | POA: Diagnosis not present

## 2015-12-08 DIAGNOSIS — M2040 Other hammer toe(s) (acquired), unspecified foot: Secondary | ICD-10-CM

## 2015-12-08 DIAGNOSIS — M79671 Pain in right foot: Secondary | ICD-10-CM

## 2015-12-08 DIAGNOSIS — E785 Hyperlipidemia, unspecified: Secondary | ICD-10-CM | POA: Insufficient documentation

## 2015-12-08 DIAGNOSIS — I10 Essential (primary) hypertension: Secondary | ICD-10-CM | POA: Insufficient documentation

## 2015-12-08 DIAGNOSIS — E119 Type 2 diabetes mellitus without complications: Secondary | ICD-10-CM | POA: Insufficient documentation

## 2015-12-08 NOTE — Progress Notes (Signed)
   Subjective:    Patient ID: Priscilla Brooks, female    DOB: Jan 26, 1951, 65 y.o.   MRN: [id number]  HPI Chief Complaint  Patient presents with  . Foot Pain    Bilateral; bunions; pt stated, "feet do not hurt, pt's doctor wanted to be seen"; pt diabetic type 2; sugar=102 this am; A1C= 5 something  . Callouses    Bilateral; plantar forefoot      Review of Systems  All other systems reviewed and are negative.      Objective:   Physical Exam        Assessment & Plan:

## 2015-12-08 NOTE — Progress Notes (Signed)
Subjective:     Patient ID: Priscilla Brooks, female   DOB: 1951-02-18, 65 y.o.   MRN: [id number]  HPI patient presents with bilateral bunions that they're concerned about because a history of diabetes and digital deformities and is referred by family physician for evaluation of these deformities and consideration of treatment   Review of Systems  All other systems reviewed and are negative.      Objective:   Physical Exam  Constitutional: She is oriented to person, place, and time.  Cardiovascular: Intact distal pulses.   Musculoskeletal: Normal range of motion.  Neurological: She is oriented to person, place, and time.  Skin: Skin is warm.  Nursing note and vitals reviewed.  neurovascular status intact muscle strength adequate range of motion within normal limits with patient found to have structural hyperostosis medial aspect first metatarsal head bilateral with deviation of the hallux against second toe with lifting the second toe and moderate flatfoot deformity with plantar callus tissue bilateral. Patient's found have good digital perfusion and is well oriented 3     Assessment:     Structural deformities of both feet was structural bunion deformity digital deformity and keratotic tissue formation    Plan:     H&P and education rendered concerning diabetes. Advised on wider-type shoes soft leather and reviewed x-rays and surgery may be accomplished at one point in the future depending on response  X-ray report indicated significant elevation of the 1 to intermetatarsal angle with structural digital deformity second digit bilateral

## 2018-08-20 DIAGNOSIS — S60551A Superficial foreign body of right hand, initial encounter: Secondary | ICD-10-CM

## 2018-08-20 NOTE — ED Notes (Signed)
Discharge instructions & Rx's reviewed w/pt... Pt verbalized understanding, all questions answered..the patient ambulated off the ED unit w/o difficulty.

## 2018-08-20 NOTE — ED Notes (Signed)
Patient still not seen by provider. Other patients have been evaluated. Patient and family upset . Nursing Supervisor contacted.

## 2018-08-20 NOTE — ED Notes (Addendum)
Provider at bedside with ultrasound. Set up for I&D.

## 2018-08-20 NOTE — ED Provider Notes (Signed)
Emergency Department History & Physical Examination        Patient Information   Date of Service: 08/20/2018   Patient Name: Margaret Stone   Date of Birth: [birth year]  Medical Record Number: [id number]  Primary Care Provider: Christy Gentles, MD   Specialists:          History of Present Illness   History Provided By:  patient  Chief Complaint   Patient presents with   ??? Hand Pain     PUNCTURE        History of Present Illness:        Margaret Stone is a 68 y.o. female with past medical history of diabetes disease oral medication), hypertension who emergency department by personal vehicle with a chief complaint of splinter in her right hand.  She reports was in her normal state of health and was working on a few steps into her garage and onto a wooden handle.  She lost her footing and when she went to catch her self she got a large splinter in her right hand.  She also bit her bottom lip at that time.  She was able to extract part of this but still feels like is a large splinter in the hand.  She does not remember when her last tetanus shot was.  She cannot take any blood.  She denies any other injuries or had a loss of consciousness.  The pain in her hand is sharp in nature and worse with any palpation.  She has taken anything for the pain.                    Past Medical History     Past Medical History:   Diagnosis Date   ??? Diabetes (HCC)    ??? Hypertension         History reviewed. No pertinent surgical history.     History reviewed. No pertinent family history.     Social History     Tobacco Use   ??? Smoking status: Never Smoker   ??? Smokeless tobacco: Never Used   Substance Use Topics   ??? Alcohol use: Never     Frequency: Never   ??? Drug use: Never        No Known Allergies     No current facility-administered medications for this encounter.      Current Outpatient Medications   Medication Sig   ??? atorvastatin (LIPITOR) 10 mg tablet Take 10 mg by mouth.    ??? lisinopril-hydroCHLOROthiazide (PRINZIDE, ZESTORETIC) 20-12.5 mg per tablet Take 1 Tab by mouth.   ??? pioglitazone (ACTOS) 15 mg tablet Take 15 mg by mouth.   ??? amoxicillin-clavulanate (AUGMENTIN) 875-125 mg per tablet Take 1 Tab by mouth two (2) times a day for 7 days. Indications: Hand injury/splinter, infection prophylaxis   ??? aspirin 81 mg chewable tablet Take 81 mg by mouth.   ??? calcium carbonate (CALTREX) 600 mg calcium (1,500 mg) tablet Take 600 mg by mouth.   ??? cholecalciferol (VITAMIN D3) (2,000 UNITS /50 MCG) cap capsule Take 1 Tab by mouth.   ??? esomeprazole (NEXIUM) 20 mg capsule Take 20 mg by mouth.   ??? ferrous sulfate 325 mg (65 mg iron) tablet Take 325 mg by mouth.   ??? fexofenadine (ALLEGRA) 180 mg tablet Take 180 mg by mouth.              Review of Systems   Review of Systems   Constitutional: Negative for  chills, fatigue and fever.   HENT:        HPI   Eyes: Negative for visual disturbance.   Respiratory: Negative for cough, shortness of breath and wheezing.    Cardiovascular: Negative for chest pain, palpitations and leg swelling.   Gastrointestinal: Negative for abdominal pain, diarrhea, nausea and vomiting.   Genitourinary: Negative for difficulty urinating and dysuria.   Musculoskeletal: Negative for arthralgias.   Skin:        See HPI   Neurological: Negative for weakness and headaches.   All other systems reviewed and are negative.            Physical Exam   Vital Signs   Patient Vitals for the past 12 hrs:   Temp Pulse Resp BP SpO2   08/20/18 2218 ??? ??? ??? 111/60 ???   08/20/18 2028 ??? 79 ??? ??? ???   08/20/18 1952 98.2 ??F (36.8 ??C) 79 16 150/85 98 %       Physical Exam  Vitals signs and nursing note reviewed.   Constitutional:       General: She is not in acute distress.     Appearance: Normal appearance. She is well-developed.      Comments: On examination middle-aged African woman standing at the bedside she appears uncomfortable and is holding her right hand.  She is  by exam both vital signs are normal other than a slightly elevated blood pressure approximately 150/85.    HENT:      Head: Normocephalic.      Comments:  HEENT emanation reveals a small bite mark on the inside of her lower lip in the midline.  There is no active.  It does not extend to the vermilion border.  It is not visible from the outside at all.  There is no foreign bodies in there.  The bottom teeth/implants feel stable and are under.,      Mouth/Throat:      Pharynx: No oropharyngeal exudate.   Eyes:      General: No scleral icterus.     Conjunctiva/sclera: Conjunctivae normal.   Neck:      Musculoskeletal: Normal range of motion and neck supple.      Thyroid: No thyromegaly.      Vascular: No JVD.   Cardiovascular:      Rate and Rhythm: Normal rate and regular rhythm.      Heart sounds: S1 normal and S2 normal.   Pulmonary:      Effort: Pulmonary effort is normal. No accessory muscle usage or respiratory distress.   Abdominal:      General: There is no distension.      Palpations: There is no pulsatile mass.   Musculoskeletal: Normal range of motion.   Lymphadenopathy:      Head:      Right side of head: No submandibular adenopathy.      Cervical: No cervical adenopathy.   Skin:     Findings: Lesion present.      Comments:     On hand examination she clearly has a there are to the right hand hyperthenar eminence.  See any foreign bodies but you can clearly feel a foreign body running towards the base of the first digit just under the skin in the palm.  She has full range of motion of the fingers and the distal sensation and strength and capillary.     Neurological:      Mental Status: She is alert and oriented to person,  place, and time.      Comments: Moving all extremities. No obvious deficits or facial asymmetry.    Psychiatric:         Speech: Speech normal.         Behavior: Behavior normal.               Labs, Radiology, EKG, Procedures, Etc.   Labs:     No results found for this or any previous visit (from the past 12 hour(s)).    Radiologic Studies:  No orders to display     CT Results  (Last 48 hours)    None        CXR Results  (Last 48 hours)    None          Pulse Oximetry Analysis: 90% on room air      Procedures:   Bedside US  Date/Time: 08/20/2018 10:00 PM  Performed by: Vaughan BrownerHoffman, Mizani Dilday T, MD  Authorized by: Vaughan BrownerHoffman, Alixandria Friedt T, MD     Written consent obtained: No    Verbal consent obtained: Yes    Given by:  Patient  Performed by:  Attending  Type of procedure:  Focused soft tissue (Soft tissue ultrasound to localize and determine the size of foreign body under the skin.)  Left leg:     Indications:  Pain, other (comment) and foreign body (Soft tissue ultrasound to localize and determine the size of foreign body under the skin.  Centimeter linear foreign body just under the skin in the palm was identified.)    Skin and subcutaneous tissue:  Adequate  Foreign Body Removal  Date/Time: 08/20/2018 10:32 PM  Performed by: Vaughan BrownerHoffman, Elowen Debruyn T, MD  Authorized by: Vaughan BrownerHoffman, Temekia Caskey T, MD     Consent:     Consent obtained:  Verbal    Consent given by:  Patient    Risks discussed:  Bleeding, incomplete removal, nerve damage, pain and poor cosmetic result  Location:     Location:  Hand    Hand location:  R palm    Depth:  Subcutaneous    Tendon involvement:  None  Pre-procedure details:     Imaging:  Ultrasound    Neurovascular status: intact      Preparation: Patient was prepped and draped in usual sterile fashion    Anesthesia (see MAR for exact dosages):     Anesthesia method:  Local infiltration    Local anesthetic:  Lidocaine 1% WITH epi  Procedure type:     Procedure complexity:  Simple  Procedure details:     Scalpel size:  15    Incision length:  5cm    Localization method:  Visualized    Dissection of underlying tissues: no      Bloodless field: yes      Removal mechanism:  Forceps    Guidance: ultrasound      Foreign bodies recovered:  1     Description:  6 mm long wooden splinter.  See photograph.    Intact foreign body removal: yes    Post-procedure details:     Neurovascular status: intact      Confirmation:  No additional foreign bodies on visualization    Skin closure:  None    Dressing:  Antibiotic ointment and non-adherent dressing    Patient tolerance of procedure:  Tolerated well, no immediate complications    Verbal and Written consent Was Obtained Prior To Taking This Photograph.  The Photograph Was Taken With [employer]  Medical Decision Making   Old Medical Records:  Old medical records.  Nursing notes.     Nursing Records:   I reviewed available vital signs, nursing notes, PMHx, PSGHx, FMHx, SHx    Care Plan for ED Visit:   I am the first provider for this patient's ED visit today.  Initial assessment performed. I discussed presenting problems, concerns and my formulated plan for today's visit with the patient and any available family members at bedside. I encouraged them to ask questions as they arise throughout the visit.             ASSESSMENT / PLAN:       Margaret Stone is a 68 y.o. female with past medical history of diabetes disease oral medication), hypertension who emergency department by personal vehicle with a chief complaint of splinter in her right hand.  She reports was in her normal state of health and was working on a few steps into her garage and onto a wooden handle.  She lost her footing and when she went to catch her self she got a large splinter in her right hand.  She also bit her bottom lip at that time.  She was able to extract part of this but still feels like is a large splinter in the hand.  She does not remember when her last tetanus shot was.  She cannot take any blood.  She denies any other injuries or had a loss of consciousness.  The pain in her hand is sharp in nature and worse with any palpation.  She has taken anything for the pain.     On examination middle-aged African woman standing at the bedside she appears uncomfortable and is holding her right hand.  She is by exam both vital signs are normal other than a slightly elevated blood pressure approximately 150/85.  HEENT emanation reveals a small bite mark on the inside of her lower lip in the midline.  There is no active.  It does not extend to the vermilion border.  It is not visible from the outside at all.  There is no foreign bodies in there.  The bottom teeth/implants feel stable and are under., CV, lung, abdomen examination are benign.  On hand examination she clearly has a there are to the right hand hyperthenar eminence.  See any foreign bodies but you can clearly feel a foreign body running towards the base of the first digit just under the skin in the palm.  She has full range of motion of the fingers and the distal sensation and strength and capillary.    Patient clearly has splinter under her skin of her right hand.  She also has small cut that her bottom lip.  Should let us.    ??? Tdap  ???Ibuprofen 800 mg p.o.  ???Ice pack  ??? I will do a bedside to delineate the extent of foreign body under the surface.  ???After this, we will use local anesthetic on the skin and attempt to extract the splinter.        Vaughan Browner, MD  EM-IM Physician                  UPDATE at 22:30:  -I used bedside ultrasound to identify that there is a linear foreign body approximately 5 m in length of the skin.  I used a sharpie marker to mark the edges.  ??? I performed local anesthesia with 1% cocaine with epinephrine.  ??? I used  a #15 scalpel blade and made a single linear incision overlying the top of the foreign body.  ???I then dissected down to identify the foreign body and removed it with forceps.  ???It was an approximately 6 cm long wooden splinter that appeared to come out intact.  ???Afterwards I examined the area in a bloodless field and do not see any more foreign bodies.   ???I had the nursing staff have the patient wash her hand with tap water and soap for 5 minutes and then apply a dressing of bacitracin, Telfa pad and Kerlix.  ??? I told the patient to leave this in place for 24 hours and then to take it off and wash the hand with simple soap and water twice a day until it heals.  We discussed healing by secondary intention and that should heal from the inside out.  ??? I gave her a prescription for Augmentin to take twice a day for the next week as well as a pill here.  ???Told her to follow-up with her primary care or here if any signs or symptoms of infection started or she had any concerns.  ???For pain control I instructed her use over-the-counter Tylenol Motrin as well as ice and elevation for pain control.  ???Return to the ER for any problems or concerns  -I discussed the results of today???s workup and the plan of care with the patient (and several family members and friends) at bedside. I answered all of their questions. They are comfortable with the plan.            Vaughan Browner, MD  EM-IM Physician                         Medications Given in the ED:  Medications   ibuprofen (MOTRIN) tablet 800 mg (800 mg Oral Given 08/20/18 2126)   diph,Pertuss(AC),Tet Vac-PF (BOOSTRIX) suspension 0.5 mL (0.5 mL IntraMUSCular Given 08/20/18 2144)   lidocaine-EPINEPHrine (XYLOCAINE) 1 %-1:100,000 injection 15 mg (15 mg IntraDERMal Given by Provider 08/20/18 2148)   amoxicillin-clavulanate (AUGMENTIN) 875-125 mg per tablet 1 Tab (1 Tab Oral Given 08/20/18 2158)   neomycin-bacitracnZn-polymyxnB (NEOSPORIN) ointment 1 Packet (1 Packet Topical Given 08/20/18 2225)   ibuprofen (MOTRIN) tablet 800 mg (800 mg Oral Dispensed at Discharge 08/20/18 2225)             Disposition   E.D. Clinical Impression/Diagnosis:  1. Splinter of hand, right, initial encounter             Disposition:  Home      Follow-up Information     Follow up With Specialties Details Why Contact Info     Haynie, Renaye Rakers, MD [facility] Practice Call in 1 day  [address]  [place] [address]  [phone]      [facility] EMERGENCY DEP Emergency Medicine  As needed, If symptoms worsen [address]  [place] [address]  [phone]          Discharge Medication List as of 08/20/2018 10:21 PM      START taking these medications    Details   amoxicillin-clavulanate (AUGMENTIN) 875-125 mg per tablet Take 1 Tab by mouth two (2) times a day for 7 days. Indications: Hand injury/splinter, infection prophylaxis, Print, Disp-14 Tab, R-0         CONTINUE these medications which have NOT CHANGED    Details   atorvastatin (LIPITOR) 10 mg tablet Take 10 mg by mouth.,  Historical Med      lisinopril-hydroCHLOROthiazide (PRINZIDE, ZESTORETIC) 20-12.5 mg per tablet Take 1 Tab by mouth., Historical Med      pioglitazone (ACTOS) 15 mg tablet Take 15 mg by mouth., Historical Med      aspirin 81 mg chewable tablet Take 81 mg by mouth., Historical Med      calcium carbonate (CALTREX) 600 mg calcium (1,500 mg) tablet Take 600 mg by mouth., Historical Med      cholecalciferol (VITAMIN D3) (2,000 UNITS /50 MCG) cap capsule Take 1 Tab by mouth., Historical Med      esomeprazole (NEXIUM) 20 mg capsule Take 20 mg by mouth., Historical Med      ferrous sulfate 325 mg (65 mg iron) tablet Take 325 mg by mouth., Historical Med      fexofenadine (ALLEGRA) 180 mg tablet Take 180 mg by mouth., Historical Med                   _______________________________   Attestations:   This note was performed by Vaughan Browner, MD in its entirety.  This note was completed with the assistance of [employer] laboratory technician.  Please forgive any errors in syntax or grammar or spelling that may have occurred.  _______________________________

## 2018-08-20 NOTE — ED Notes (Signed)
Provider made aware of patient's request for pain medications.

## 2018-08-20 NOTE — ED Notes (Signed)
Pt's rt hand was treated w/triple abx oint w/telfa drsg placed over top and secured w/gauze drsg.

## 2018-08-20 NOTE — ED Notes (Signed)
Pt ambulatory to BR.  Washing hands w/soap & water per provider's instruction.

## 2018-08-20 NOTE — ED Triage Notes (Signed)
TRIPPED GOING UP GARAGE STEPS AND LONG SPLINTER FROM RAILING IN PALM OF RT HAND ATTEMPTED TO PULL OUT BUT STILL PIECE UNDER SKIN

## 2018-08-20 NOTE — ED Notes (Signed)
Bedside shift change report given to Donna RN (oncoming nurse) by Elaine Sopko, RN (offgoing nurse). Report included the following information SBAR and ED Summary.

## 2018-08-20 NOTE — ED Notes (Signed)
Patient declines getting undressed states she only injured her lower lip and her right hand.

## 2018-08-20 NOTE — ED Notes (Signed)
Provider at bedside with ultrasound. Set up for I&D.

## 2018-08-20 NOTE — ED Notes (Signed)
Patient still not seen by provider. Other patients have been evaluated. Patient and family upset . Nursing Supervisor contacted.

## 2018-08-20 NOTE — ED Provider Notes (Signed)
ED Provider Notes by Vaughan Browner, MD at 08/20/18 1947                Author: Vaughan Browner, MD  Service: Emergency Medicine  Author Type: Physician       Filed: 08/20/18 2237  Date of Service: 08/20/18 1947  Status: Signed          Editor: Vaughan Browner, MD (Physician)            Procedure Orders        1. Bedside US [[id number]] ordered by Vaughan Browner, MD        2. Foreign Body Removal [[id number]] ordered by Vaughan Browner, MD                              Emergency Department History & Physical Examination                Patient Information     Date of Service: 08/20/2018    Patient Name: Margaret Stone    Date of Birth: [birth year]   Medical Record Number: [id number]   Primary Care Provider: Christy Gentles, MD    Specialists:                 History of Present Illness     History Provided By:  patient     Chief Complaint       Patient presents with        ?  Hand Pain             PUNCTURE            History of Present Illness:         Margaret Stone is a 68 y.o. female with past medical history of diabetes disease oral medication), hypertension who emergency department by personal vehicle with a chief complaint of splinter  in her right hand.  She reports was in her normal state of health and was working on a few steps into her garage and onto a wooden handle.  She lost her footing and when she went to catch her self she got a large splinter in her right hand.  She also  bit her bottom lip at that time.  She was able to extract part of this but still feels like is a large splinter in the hand.  She does not remember when her last tetanus shot was.  She cannot take any blood.  She denies any other injuries or had a loss  of consciousness.  The pain in her hand is sharp in nature and worse with any palpation.  She has taken anything for the pain.                                Past Medical History          Past Medical History:        Diagnosis  Date         ?  Diabetes (HCC)            ?  Hypertension              History reviewed. No pertinent surgical history.       History reviewed. No pertinent family history.         Social History  Tobacco Use         ?  Smoking status:  Never Smoker     ?  Smokeless tobacco:  Never Used       Substance Use Topics         ?  Alcohol use:  Never              Frequency:  Never         ?  Drug use:  Never            No Known Allergies         No current facility-administered medications for this encounter.           Current Outpatient Medications        Medication  Sig         ?  atorvastatin (LIPITOR) 10 mg tablet  Take 10 mg by mouth.     ?  lisinopril-hydroCHLOROthiazide (PRINZIDE, ZESTORETIC) 20-12.5 mg per tablet  Take 1 Tab by mouth.     ?  pioglitazone (ACTOS) 15 mg tablet  Take 15 mg by mouth.     ?  amoxicillin-clavulanate (AUGMENTIN) 875-125 mg per tablet  Take 1 Tab by mouth two (2) times a day for 7 days. Indications: Hand injury/splinter, infection prophylaxis     ?  aspirin 81 mg chewable tablet  Take 81 mg by mouth.     ?  calcium carbonate (CALTREX) 600 mg calcium (1,500 mg) tablet  Take 600 mg by mouth.     ?  cholecalciferol (VITAMIN D3) (2,000 UNITS /50 MCG) cap capsule  Take 1 Tab by mouth.     ?  esomeprazole (NEXIUM) 20 mg capsule  Take 20 mg by mouth.     ?  ferrous sulfate 325 mg (65 mg iron) tablet  Take 325 mg by mouth.         ?  fexofenadine (ALLEGRA) 180 mg tablet  Take 180 mg by mouth.                       Review of Systems     Review of Systems    Constitutional: Negative for chills, fatigue and fever.    HENT:         HPI    Eyes: Negative for visual disturbance.    Respiratory: Negative for cough, shortness of breath and wheezing.     Cardiovascular: Negative for chest pain, palpitations and leg swelling.    Gastrointestinal: Negative for abdominal pain, diarrhea, nausea and vomiting.    Genitourinary: Negative for difficulty urinating and dysuria.    Musculoskeletal: Negative for arthralgias.    Skin:          See HPI    Neurological: Negative for weakness and headaches.    All other systems reviewed and are negative.                    Physical Exam     Vital Signs    Patient Vitals for the past 12 hrs:            Temp  Pulse  Resp  BP  SpO2            08/20/18 2218  --  --  --  111/60  --            08/20/18 2028  --  79  --  --  --  08/20/18 1952  98.2 ??F (36.8 ??C)  79  16  150/85  98 %           Physical Exam   Vitals signs and nursing note reviewed.   Constitutional:        General: She is not in acute distress.     Appearance: Normal appearance. She is well-developed.      Comments: On examination middle-aged African woman standing at the bedside she  appears uncomfortable and is holding her right hand.  She is by exam both vital signs are normal other than a slightly elevated blood pressure approximately 150/85.    HENT:       Head: Normocephalic.      Comments:  HEENT emanation reveals a small bite mark on the inside of her lower lip in the midline.  There is no active.  It does not extend to the vermilion border.   It is not visible from the outside at all.  There is no foreign bodies in there.  The bottom teeth/implants feel stable and are under.,      Mouth/Throat:      Pharynx: No oropharyngeal  exudate.   Eyes :       General: No scleral icterus.     Conjunctiva/sclera: Conjunctivae normal.    Neck:       Musculoskeletal: Normal range of motion and neck supple.      Thyroid: No thyromegaly.      Vascular: No JVD.    Cardiovascular:       Rate and Rhythm: Normal rate and regular rhythm.      Heart sounds: S1 normal and S2 normal.    Pulmonary:       Effort: Pulmonary effort is normal. No accessory muscle usage or respiratory distress.   Abdominal:      General: There is no distension.       Palpations: There is no pulsatile mass.     Musculoskeletal: Normal range of motion.     Lymphadenopathy:       Head:      Right side of head: No submandibular adenopathy.      Cervical: No cervical adenopathy.     Skin:      Findings: Lesion present.      Comments:     On hand examination she  clearly has a there are to the right hand hyperthenar eminence.  See any foreign bodies but you can clearly feel a foreign body running towards the base of the first digit just under the skin in the palm.  She has full range of motion of the fingers and  the distal sensation and strength and capillary.      Neurological:       Mental Status: She is alert and oriented to person, place, and time.      Comments: Moving all extremities. No obvious deficits or facial asymmetry.     Psychiatric :         Speech: Speech normal.         Behavior: Behavior normal.                        Labs, Radiology, EKG, Procedures, Etc.     Labs:     No results found for this or any previous visit (from the past 12 hour(s)).      Radiologic Studies:     No orders to display  CT Results   (Last 48 hours)          None                 CXR Results   (Last 48 hours)          None                  Pulse Oximetry Analysis: 90% on room air        Procedures:    Bedside US  Date/Time: 08/20/2018 10:00  PM   Performed by:  Vaughan Browner, MD   Authorized by:  Vaughan Browner, MD      Written consent obtained: No     Verbal consent obtained:  Yes     Given by:  Patient   Performed by:  Attending   Type of procedure:  Focused soft tissue (Soft tissue ultrasound to localize  and determine the size of foreign body under the skin.)   Left leg:      Indications:  Pain, other (comment) and foreign body (Soft tissue ultrasound  to localize and determine the size of foreign body under the skin.  Centimeter linear foreign body just under the skin in the palm was identified.)     Skin and subcutaneous tissue:  Adequate    Foreign Body Removal  Date/Time:  08/20/2018 10:32 PM   Performed by:  Vaughan Browner, MD   Authorized by:  Vaughan Browner, MD       Consent:      Consent obtained:  Verbal     Consent given by:  Patient     Risks discussed:  Bleeding,  incomplete removal, nerve damage, pain and poor cosmetic result   Location:      Location:  Hand     Hand location:  R palm     Depth:  Subcutaneous     Tendon involvement:  None   Pre-procedure details:      Imaging:  Ultrasound    Neurovascular status: intact       Preparation: Patient was prepped and draped in usual sterile fashion     Anesthesia (see MAR for exact dosages):      Anesthesia method:  Local infiltration     Local anesthetic:  Lidocaine 1% WITH epi   Procedure type:      Procedure complexity:  Simple   Procedure details:      Scalpel size:  15     Incision length:  5cm     Localization method:  Visualized    Dissection of underlying tissues: no       Bloodless field: yes       Removal mechanism:  Forceps    Guidance: ultrasound        Foreign bodies recovered:  1     Description:  6 mm long wooden splinter.  See photograph.    Intact foreign body removal:  yes     Post-procedure details:     Neurovascular status: intact       Confirmation:  No additional foreign bodies on visualization     Skin closure:  None     Dressing:  Antibiotic ointment and non-adherent dressing     Patient tolerance of procedure:  Tolerated well, no immediate complications      Verbal and Written consent Was Obtained Prior To Taking This Photograph.   The Photograph Was Taken With [employer]  Medical Decision Making     Old Medical Records:   Old medical records.   Nursing notes.       Nursing Records:    I reviewed available vital signs, nursing notes, PMHx, PSGHx, FMHx, SHx      Care Plan for ED Visit:    I am the first provider for this patient's ED visit today.  Initial assessment performed. I discussed presenting problems, concerns and my formulated plan for today's visit with the patient and any available family members at bedside. I encouraged them  to ask questions as they arise throughout the visit.                   ASSESSMENT / PLAN:        Margaret Stone is a 68  y.o. female with past medical history of diabetes disease oral medication), hypertension who emergency department by personal vehicle with a chief complaint of splinter  in her right hand.  She reports was in her normal state of health and was working on a few steps into her garage and onto a wooden handle.  She lost her footing and when she went to catch her self she got a large splinter in her right hand.  She also  bit her bottom lip at that time.  She was able to extract part of this but still feels like is a large splinter in the hand.  She does not remember when her last tetanus shot was.  She cannot take any blood.  She denies any other injuries or had a loss  of consciousness.  The pain in her hand is sharp in nature and worse with any palpation.  She has taken anything for the pain.      On examination middle-aged African woman standing at the bedside she appears uncomfortable and is holding her right hand.  She is by exam both vital signs are normal other than a slightly elevated blood pressure approximately 150/85.  HEENT emanation  reveals a small bite mark on the inside of her lower lip in the midline.  There is no active.  It does not extend to the vermilion border.  It is not visible from the outside at all.  There is no foreign bodies in there.  The bottom teeth/implants feel  stable and are under., CV, lung, abdomen examination are benign.  On hand examination she clearly has a there are to the right hand hyperthenar eminence.  See any foreign bodies but you can clearly feel a foreign body running towards the base of the first  digit just under the skin in the palm.  She has full range of motion of the fingers and the distal sensation and strength and capillary.      Patient clearly has splinter under her skin of her right hand.  She also has small cut that her bottom lip.  Should let us.      - Tdap   -Ibuprofen 800 mg p.o.   -Ice pack   - I will do a bedside to delineate the extent of foreign body  under the surface.   -After this, we will use local anesthetic on the skin and attempt to extract the splinter.            Vaughan Browner, MD   EM-IM Physician                           UPDATE  at 22:30:   -I used bedside ultrasound to identify that there is a linear foreign body approximately 5 m in length of the skin.  I used a sharpie marker to mark the edges.   - I performed local anesthesia with 1% cocaine with epinephrine.   - I used a #15 scalpel blade and made a single linear incision overlying the top of the foreign body.   -I then dissected down to identify the foreign body and removed it with forceps.   -It was an approximately 6 cm long wooden splinter that appeared to come out intact.   -Afterwards I examined the area in a bloodless field and do not see any more foreign bodies.   -I had the nursing staff have the patient wash her hand with tap water and soap for 5 minutes and then apply a dressing of bacitracin, Telfa pad and Kerlix.   - I told the patient to leave this in place for 24 hours and then to take it off and wash the hand with simple soap and water twice a day until it heals.  We discussed healing by secondary intention and that should heal from the inside out.   - I gave her a prescription for Augmentin to take twice a day for the next week as well as a pill here.   -Told her to follow-up with her primary care or here if any signs or symptoms of infection started or she had any concerns.   -For pain control I instructed her use over-the-counter Tylenol Motrin as well as ice and elevation for pain control.   -Return to the ER for any problems or concerns   -I discussed the results of todays workup and the plan of care with the patient (and several family members and friends) at bedside. I answered all of their questions. They are comfortable with the plan.                 Vaughan Browner, MD   EM-IM Physician                                     Medications Given in the ED:     Medications        ibuprofen (MOTRIN) tablet 800 mg (800 mg Oral Given 08/20/18 2126)     diph,Pertuss(AC),Tet Vac-PF (BOOSTRIX) suspension 0.5 mL (0.5 mL IntraMUSCular Given 08/20/18 2144)     lidocaine-EPINEPHrine (XYLOCAINE) 1 %-1:100,000 injection 15 mg (15 mg IntraDERMal Given by Provider 08/20/18 2148)     amoxicillin-clavulanate (AUGMENTIN) 875-125 mg per tablet 1 Tab (1 Tab Oral Given 08/20/18 2158)     neomycin-bacitracnZn-polymyxnB (NEOSPORIN) ointment 1 Packet (1 Packet Topical Given 08/20/18 2225)       ibuprofen (MOTRIN) tablet 800 mg (800 mg Oral Dispensed at Discharge 08/20/18 2225)                      Disposition     E.D. Clinical Impression/Diagnosis:      1.  Splinter of hand, right, initial encounter                   Disposition:   Home           Follow-up Information               Follow up With  Specialties  Details  Why  Contact Info  Christy GentlesHaynie, Lisa Jenkins, MD  [facility]  Call in 1 day    [address]   [place] [id number]   [phone]                 [facility]  Emergency Medicine    As needed, If symptoms worsen  [address]   [place] [id number]   [phone]                  Discharge Medication List as of 08/20/2018 10:21 PM              START taking these medications          Details        amoxicillin-clavulanate (AUGMENTIN) 875-125 mg per tablet  Take 1 Tab by mouth two (2) times a day for 7 days. Indications: Hand injury/splinter, infection prophylaxis, Print, Disp-14 Tab, R-0                     CONTINUE these medications which have NOT CHANGED          Details        atorvastatin (LIPITOR) 10 mg tablet  Take 10 mg by mouth., Historical Med               lisinopril-hydroCHLOROthiazide (PRINZIDE, ZESTORETIC) 20-12.5 mg per tablet  Take 1 Tab by mouth., Historical Med               pioglitazone (ACTOS) 15 mg tablet  Take 15 mg by mouth., Historical Med               aspirin 81 mg chewable tablet  Take 81 mg by mouth., Historical Med               calcium carbonate  (CALTREX) 600 mg calcium (1,500 mg) tablet  Take 600 mg by mouth., Historical Med               cholecalciferol (VITAMIN D3) (2,000 UNITS /50 MCG) cap capsule  Take 1 Tab by mouth., Historical Med               esomeprazole (NEXIUM) 20 mg capsule  Take 20 mg by mouth., Historical Med               ferrous sulfate 325 mg (65 mg iron) tablet  Take 325 mg by mouth., Historical Med               fexofenadine (ALLEGRA) 180 mg tablet  Take 180 mg by mouth., Historical Med                                  _______________________________    Attestations:    This note was performed by Vaughan BrownerZachary T Sabeen Piechocki, MD in its entirety.   This note was completed with the assistance of "[employer] laboratory technicianvoice-to-text technology.  Please forgive any errors in syntax or grammar or spelling that may have occurred.   _______________________________

## 2018-08-20 NOTE — ED Notes (Signed)
Pt's rt hand was treated w/triple abx oint w/telfa drsg placed over top and secured w/gauze drsg.

## 2018-08-20 NOTE — ED Notes (Signed)
TRIPPED GOING UP GARAGE STEPS AND LONG SPLINTER FROM RAILING IN PALM OF RT HAND ATTEMPTED TO PULL OUT BUT STILL PIECE UNDER SKIN

## 2018-08-20 NOTE — ED Notes (Signed)
Pt ambulatory to BR.  Washing hands w/soap & water per provider's instruction.

## 2018-08-20 NOTE — ED Notes (Signed)
Patient declines getting undressed states she only injured her lower lip and her right hand.

## 2018-08-21 ENCOUNTER — Inpatient Hospital Stay
Admit: 2018-08-21 | Discharge: 2018-08-21 | Disposition: A | Discharge status: Home / Self Care | Payer: MEDICARE | Attending: Emergency Medicine

## 2018-08-21 MED ORDER — LIDOCAINE-EPINEPHRINE 1 %-1:100,000 IJ SOLN
1 %-:00,000 | INTRAMUSCULAR | Status: AC
Start: 2018-08-21 — End: 2018-08-20
  Administered 2018-08-21: 03:00:00 via INTRADERMAL

## 2018-08-21 MED ORDER — IBUPROFEN 400 MG TAB
400 mg | ORAL | Status: AC
Start: 2018-08-21 — End: 2018-08-20
  Administered 2018-08-21: 02:00:00 via ORAL

## 2018-08-21 MED ORDER — AMOXICILLIN CLAVULANATE 875 MG-125 MG TAB
875-125 mg | ORAL | Status: AC
Start: 2018-08-21 — End: 2018-08-20
  Administered 2018-08-21: 03:00:00 via ORAL

## 2018-08-21 MED ORDER — IBUPROFEN 400 MG TAB
400 mg | ORAL | Status: AC
Start: 2018-08-21 — End: 2018-08-20

## 2018-08-21 MED ORDER — DIPHTH,PERTUS(AC)TETANUS VAC(PF) 2.5 LF UNIT-8 MCG-5 LF/0.5 ML INJ
INTRAMUSCULAR | Status: AC
Start: 2018-08-21 — End: 2018-08-20
  Administered 2018-08-21: 03:00:00 via INTRAMUSCULAR

## 2018-08-21 MED ORDER — AMOXICILLIN CLAVULANATE 875 MG-125 MG TAB
875-125 mg | ORAL_TABLET | Freq: Two times a day (BID) | ORAL | 0 refills | Status: AC
Start: 2018-08-21 — End: 2018-08-27

## 2018-08-21 MED ORDER — NEOMYCIN-BACITRACN ZN-POLYMYXIN 3.5 MG-400 UNIT-5,000 UNIT TOP OINT PK
3.5-400-5000 mg-unit-unit | CUTANEOUS | Status: AC
Start: 2018-08-21 — End: 2018-08-20
  Administered 2018-08-21: 03:00:00 via TOPICAL

## 2018-08-21 MED FILL — TRIPLE ANTIBIOTIC 3.5 MG-400 UNIT-5,000 UNIT TOPICAL OINTMENT PACKET: 3.5-400-5000 mg-unit-unit | CUTANEOUS | Qty: 1

## 2018-08-21 MED FILL — IBUPROFEN 400 MG TAB: 400 mg | ORAL | Qty: 2

## 2018-08-21 MED FILL — XYLOCAINE WITH EPINEPHRINE 1 %-1:100,000 INJECTION SOLUTION: 1 %-:00,000 | INTRAMUSCULAR | Qty: 20

## 2018-08-21 MED FILL — AMOXICILLIN CLAVULANATE 875 MG-125 MG TAB: 875-125 mg | ORAL | Qty: 1

## 2018-08-21 MED FILL — BOOSTRIX TDAP 2.5 LF UNIT-8 MCG-5 LF/0.5 ML INTRAMUSCULAR SUSPENSION: INTRAMUSCULAR | Qty: 0.5

## 2018-12-13 LAB — HEMOGLOBIN A1C: Hemoglobin A1C, External: 5.9 % (ref 4.8–6.0)

## 2019-10-22 LAB — HEMOGLOBIN A1C: Hemoglobin A1C, External: 6.1 % — ABNORMAL HIGH (ref 4.8–6.0)

## 2020-01-22 LAB — HEMOGLOBIN A1C: Hemoglobin A1C, External: 6.1 % — ABNORMAL HIGH (ref 4.8–6.0)

## 2020-07-08 LAB — HEMOGLOBIN A1C: Hemoglobin A1C, External: 6 % (ref 4.8–6.0)

## 2021-01-22 LAB — HEMOGLOBIN A1C: Hemoglobin A1C, External: 5.9 % (ref 4.8–6.0)

## 2021-07-11 ENCOUNTER — Emergency Department: Admit: 2021-07-11 | Payer: MEDICARE | Primary: Family Medicine

## 2021-07-11 ENCOUNTER — Inpatient Hospital Stay
Admit: 2021-07-11 | Discharge: 2021-07-11 | Disposition: A | Discharge status: Home / Self Care | Payer: MEDICARE | Attending: Emergency Medicine

## 2021-07-11 DIAGNOSIS — S63502A Unspecified sprain of left wrist, initial encounter: Secondary | ICD-10-CM

## 2021-07-11 NOTE — ED Notes (Signed)
Twisted ankle at a funeral unlevel ground. Can ambulate but still swollen

## 2021-07-11 NOTE — ED Provider Notes (Signed)
ED Provider Notes by Brooks Sailors., MD at 07/11/21 1351                Author: Brooks Sailors., MD  Service: Emergency Medicine  Author Type: Physician       Filed: 07/11/21 1357  Date of Service: 07/11/21 1351  Status: Signed          Editor: Giannie Soliday, Lise Auer., MD (Physician)               EMERGENCY DEPARTMENT HISTORY AND PHYSICAL EXAM               Date: (Not on file)   Patient Name: Margaret Stone        History of Presenting Illness          Chief Complaint       Patient presents with        ?  Ankle Injury           History Provided By: Patient      HPI: Margaret Stone is a 70 y.o. female, pmhx diabetes and hypertension, who presents ambulatory to the ED c/o ankle pain      Patient explains that she was carrying flowers Saturday at a funeral and she slipped on the concrete twisting her left ankle and going to the ground.  She did try to brace her fall with her left arm.  She is complaining of pain at the base of her left  thumb and her left ankle         PCP: Margaret Janus, MD         There are no other complaints, changes, or physical findings at this time.         Current Outpatient Medications          Medication  Sig  Dispense  Refill           ?  aspirin 81 mg chewable tablet  Take 81 mg by mouth.         ?  atorvastatin (LIPITOR) 10 mg tablet  Take 10 mg by mouth.         ?  calcium carbonate (CALTREX) 600 mg calcium (1,500 mg) tablet  Take 600 mg by mouth.         ?  cholecalciferol (VITAMIN D3) (2,000 UNITS /50 MCG) cap capsule  Take 1 Tab by mouth.         ?  esomeprazole (NEXIUM) 20 mg capsule  Take 20 mg by mouth.               ?  ferrous sulfate 325 mg (65 mg iron) tablet  Take 325 mg by mouth.               ?  fexofenadine (ALLEGRA) 180 mg tablet  Take 180 mg by mouth.         ?  lisinopril-hydroCHLOROthiazide (PRINZIDE, ZESTORETIC) 20-12.5 mg per tablet  Take 1 Tab by mouth.               ?  pioglitazone (ACTOS) 15 mg tablet  Take 15 mg by mouth.                  Past History        Past Medical History:     Past Medical History:        Diagnosis  Date         ?  Diabetes (HCC)           ?  Hypertension             Past Surgical History:   History reviewed. No pertinent surgical history.      Family History:   History reviewed. No pertinent family history.      Social History:     Social History          Tobacco Use         ?  Smoking status:  Never     ?  Smokeless tobacco:  Never       Substance Use Topics         ?  Alcohol use:  Never         ?  Drug use:  Never           Allergies:   No Known Allergies           Review of Systems     Review of Systems    Constitutional:  Negative for activity change, appetite change, chills, fever and unexpected weight change.    HENT:  Negative for congestion.     Eyes:  Negative for pain and visual disturbance.    Respiratory:  Negative for cough and shortness of breath.     Cardiovascular:  Negative for chest pain.    Gastrointestinal:  Negative for abdominal pain, diarrhea, nausea and vomiting.    Genitourinary:  Negative for dysuria.    Musculoskeletal:  Positive for arthralgias and joint swelling . Negative for back pain.    Skin:  Negative for rash.    Neurological:  Negative for headaches.         Physical Exam     Physical Exam   Vitals and nursing note reviewed.    Constitutional:        Appearance: She is well-developed. She is not diaphoretic.       Comments: Obese elderly female in minimal acute distress    HENT:       Head: Normocephalic and atraumatic.    Eyes:       General:          Right eye: No discharge.          Left eye: No discharge.       Conjunctiva/sclera: Conjunctivae normal.       Pupils: Pupils are equal, round, and reactive to light.     Cardiovascular:       Rate and Rhythm: Normal rate.    Pulmonary:       Effort: Pulmonary effort is normal. No respiratory distress.     Musculoskeletal:          General: Swelling present. Normal range of motion.       Cervical back: Normal range of motion and neck supple.        Comments: Swelling left ankle extending down to the dorsum of the foot.  DP pulse within normal limits.  Left hand with bruising at the base of the thumb along the thenar eminence.  There is no  obvious hamate tenderness.    Skin:      General: Skin is warm and dry.       Findings: No rash.    Neurological:       Mental Status: She is alert and oriented to person, place, and time.       Cranial Nerves: No cranial nerve deficit.       Motor: No abnormal muscle tone.         Diagnostic Study  Results        Labs -    No results found for this or any previous visit (from the past 12 hour(s)).      Radiologic Studies -      XR ANKLE LT MIN 3 V       Final Result     No acute fracture or dislocation.                   XR HAND LT MIN 3 V       Final Result     Cortical lucency/irregularity of the hamate, along the ulnar aspect     of the distal carpal row. This may represent a nondisplaced fracture. Recommend     correlation with any point tenderness in this location.                 CT Results  (Last 48 hours)             None                    CXR Results  (Last 48 hours)             None                          Medical Decision Making     I am the first provider for this patient.      I reviewed the vital signs, available nursing notes, past medical history, past surgical history, family history and social history.      Vital Signs-Reviewed the patient's vital signs.   Patient Vitals for the past 12 hrs:            Temp  Pulse  Resp  BP  SpO2            07/11/21 1350  --  62  16  (!) 176/78  --            07/11/21 1242  98.2 ??F (36.8 ??C)  60  16  (!) 163/74  100 %           Records Reviewed: Nursing Notes and Old Medical Records      Provider Notes (Medical Decision Making):    MDM: Elderly female presents with left ankle and left hand injury.  Patient is hypertensive and has been waiting in the ER 2 hours for evaluation.  Discussed management of blood pressure at home.  She  is using Tylenol arthritis for pain  and states her pain is completely controlled today and she is actually able to ambulate on her left ankle today which she was not able to yesterday.  Discussed possible x-ray fracture in the wrist and patient will follow-up  with orthopedics if the pain is not improved after 48 hours.  She is appropriate for outpatient management.             Critical Care Time:    0           Diagnosis        Clinical Impression:       1.  Wrist sprain, left, initial encounter         2.  Sprain of left ankle, unspecified ligament, initial encounter            PLAN:   1.      Current Discharge Medication List  2.      Follow-up Information                  Follow up With  Specialties  Details  Why  Contact Info              Margaret Janus, MD  Family Medicine    As needed  [address]   [place] [address]   [phone]                 Alphonzo Lemmings, MD  Orthopedic Surgery  Schedule an appointment as soon as possible for a visit   If the pain in your hand continues past Wednesday  [address]   [address]   [place] [address]   [phone]                     Return to ED if worse       Disposition:   Home            Please note, this dictation was completed with Dragon, the computer voice recognition software. Quite often unanticipated grammatical, syntax, homophones,  and other interpretive errors are inadvertently transcribed by the computer software. Please disregard these errors. Please excuse any errors that have escaped final proof reading.

## 2021-07-11 NOTE — ED Notes (Signed)
I have reviewed discharge instructions with the patient.  The patient verbalized understanding. Discharge medications discussed with patient. No questions at this time. Ambulated out without difficulty.

## 2021-07-11 NOTE — ED Notes (Signed)
Ace wrap applied to left foot/ankle

## 2021-07-11 NOTE — ED Notes (Signed)
patient had xrays

## 2021-07-11 NOTE — ED Notes (Signed)
Also has a left wrist injury. Bruised and swollen

## 2021-11-02 LAB — HEMOGLOBIN A1C: Hemoglobin A1C, External: 6 % — ABNORMAL HIGH (ref 0.0–5.7)

## 2023-06-15 ENCOUNTER — Encounter: Admit: 2023-06-15 | Admitting: Specialist

## 2023-06-15 ENCOUNTER — Inpatient Hospital Stay
Admit: 2023-06-15 | Disposition: A | Discharge status: Home / Self Care | Payer: MEDICARE | Source: Ambulatory Visit | Admitting: Specialist | Primary: Family Medicine

## 2023-06-15 DIAGNOSIS — I495 Sick sinus syndrome: Principal | ICD-10-CM

## 2023-07-05 ENCOUNTER — Inpatient Hospital Stay
Admit: 2023-07-05 | Discharge: 2023-07-05 | Disposition: A | Discharge status: Home / Self Care | Payer: MEDICARE | Source: Ambulatory Visit | Attending: Specialist | Admitting: Specialist

## 2023-07-05 ENCOUNTER — Non-Acute Institutional Stay: Admit: 2023-07-05 | Payer: MEDICARE | Primary: Family Medicine

## 2023-07-05 DIAGNOSIS — I495 Sick sinus syndrome: Secondary | ICD-10-CM

## 2023-07-05 LAB — POCT GLUCOSE: POC Glucose: 106 mg/dL (ref 65–117)

## 2023-07-05 MED ORDER — FENTANYL CITRATE (PF) 100 MCG/2ML IJ SOLN
100 | INTRAMUSCULAR | Status: AC
Start: 2023-07-05 — End: ?

## 2023-07-05 MED ORDER — LIDOCAINE HCL 1 % IJ SOLN
1 | INTRAMUSCULAR | Status: DC | PRN
Start: 2023-07-05 — End: 2023-07-05
  Administered 2023-07-05: 16:00:00 30 via INTRADERMAL

## 2023-07-05 MED ORDER — HEPARIN (PORCINE) IN NACL 1000-0.9 UT/500ML-% IV SOLN
INTRAVENOUS | Status: AC
Start: 2023-07-05 — End: ?

## 2023-07-05 MED ORDER — SODIUM CHLORIDE 0.9 % IV SOLN
0.9 | INTRAVENOUS | Status: DC | PRN
Start: 2023-07-05 — End: 2023-07-05
  Administered 2023-07-05: 15:00:00 via INTRAVENOUS

## 2023-07-05 MED ORDER — LIDOCAINE HCL 1 % IJ SOLN
1 | INTRAMUSCULAR | Status: AC
Start: 2023-07-05 — End: ?

## 2023-07-05 MED ORDER — MIDAZOLAM HCL 2 MG/2ML IJ SOLN
2 | INTRAMUSCULAR | Status: AC
Start: 2023-07-05 — End: ?

## 2023-07-05 MED ORDER — ACETAMINOPHEN 325 MG PO TABS
325 | ORAL | Status: DC | PRN
Start: 2023-07-05 — End: 2023-07-05

## 2023-07-05 MED ORDER — CEFAZOLIN SODIUM 2 GM/20ML IV SOSY
2 | Freq: Once | INTRAVENOUS | Status: DC | PRN
Start: 2023-07-05 — End: 2023-07-05
  Administered 2023-07-05: 15:00:00 2 via INTRAVENOUS

## 2023-07-05 MED ORDER — SODIUM CHLORIDE 0.9 % IV SOLN
0.9 % | INTRAVENOUS | Status: DC | PRN
Start: 2023-07-05 — End: 2023-07-05
  Administered 2023-07-05: 15:00:00 40 via INTRAVENOUS

## 2023-07-05 MED ORDER — MIDAZOLAM HCL 2 MG/2ML IJ SOLN
2 | Freq: Once | INTRAMUSCULAR | Status: DC | PRN
Start: 2023-07-05 — End: 2023-07-05
  Administered 2023-07-05: 15:00:00 1 via INTRAVENOUS

## 2023-07-05 MED ORDER — DEXAMETHASONE SODIUM PHOSPHATE 4 MG/ML IJ SOLN
4 | INTRAMUSCULAR | Status: AC
Start: 2023-07-05 — End: ?

## 2023-07-05 MED ORDER — CEFAZOLIN SODIUM 1 G IJ SOLR
1 | INTRAMUSCULAR | Status: AC
Start: 2023-07-05 — End: ?

## 2023-07-05 MED ORDER — ONDANSETRON HCL 4 MG/2ML IJ SOLN
4 | INTRAMUSCULAR | Status: AC
Start: 2023-07-05 — End: ?

## 2023-07-05 MED ORDER — FENTANYL CITRATE (PF) 100 MCG/2ML IJ SOLN
100 | Freq: Once | INTRAMUSCULAR | Status: DC | PRN
Start: 2023-07-05 — End: 2023-07-05
  Administered 2023-07-05: 15:00:00 50 via INTRAVENOUS

## 2023-07-05 MED ORDER — PROPOFOL 100 MG/10ML IV EMUL
100 | Freq: Once | INTRAVENOUS | Status: DC | PRN
Start: 2023-07-05 — End: 2023-07-05
  Administered 2023-07-05: 15:00:00 100 via INTRAVENOUS
  Administered 2023-07-05: 15:00:00 50 via INTRAVENOUS

## 2023-07-05 MED ORDER — NORMAL SALINE FLUSH 0.9 % IV SOLN
0.9 | Freq: Two times a day (BID) | INTRAVENOUS | Status: DC
Start: 2023-07-05 — End: 2023-07-05

## 2023-07-05 MED ORDER — SUGAMMADEX SODIUM 200 MG/2ML IV SOLN
200 | INTRAVENOUS | Status: AC
Start: 2023-07-05 — End: ?

## 2023-07-05 MED ORDER — ONDANSETRON HCL 4 MG/2ML IJ SOLN
4 | Freq: Once | INTRAMUSCULAR | Status: DC | PRN
Start: 2023-07-05 — End: 2023-07-05
  Administered 2023-07-05: 15:00:00 4 via INTRAVENOUS

## 2023-07-05 MED ORDER — NORMAL SALINE FLUSH 0.9 % IV SOLN
0.9 | INTRAVENOUS | Status: DC | PRN
Start: 2023-07-05 — End: 2023-07-05

## 2023-07-05 MED ORDER — SODIUM CHLORIDE 0.9 % IV SOLN
0.9 | INTRAVENOUS | Status: DC | PRN
Start: 2023-07-05 — End: 2023-07-05

## 2023-07-05 MED ORDER — DEXAMETHASONE 4 MG/ML IJ SOLN (MIXTURES ONLY)
4 | Freq: Once | INTRAMUSCULAR | Status: DC | PRN
Start: 2023-07-05 — End: 2023-07-05
  Administered 2023-07-05: 15:00:00 8 via INTRAVENOUS

## 2023-07-05 MED ORDER — CEFAZOLIN SODIUM 1 G IJ SOLR
1 | INTRAMUSCULAR | Status: DC | PRN
Start: 2023-07-05 — End: 2023-07-05
  Administered 2023-07-05: 16:00:00

## 2023-07-05 MED FILL — BRIDION 200 MG/2ML IV SOLN: 200 MG/2ML | INTRAVENOUS | Qty: 4

## 2023-07-05 MED FILL — LIDOCAINE HCL 1 % IJ SOLN: 1 % | INTRAMUSCULAR | Qty: 40

## 2023-07-05 MED FILL — ONDANSETRON HCL 4 MG/2ML IJ SOLN: 4 MG/2ML | INTRAMUSCULAR | Qty: 2

## 2023-07-05 MED FILL — FENTANYL CITRATE (PF) 100 MCG/2ML IJ SOLN: 100 MCG/2ML | INTRAMUSCULAR | Qty: 2

## 2023-07-05 MED FILL — DEXAMETHASONE SODIUM PHOSPHATE 4 MG/ML IJ SOLN: 4 MG/ML | INTRAMUSCULAR | Qty: 1

## 2023-07-05 MED FILL — HEPARIN (PORCINE) IN NACL 1000-0.9 UT/500ML-% IV SOLN: INTRAVENOUS | Qty: 500

## 2023-07-05 MED FILL — CEFAZOLIN SODIUM 1 G IJ SOLR: 1 g | INTRAMUSCULAR | Qty: 3000

## 2023-07-05 MED FILL — MIDAZOLAM HCL 2 MG/2ML IJ SOLN: 2 MG/ML | INTRAMUSCULAR | Qty: 2

## 2023-07-05 NOTE — Anesthesia Pre-Procedure Evaluation (Signed)
 Department of Anesthesiology  Preprocedure Note       Name:  Margaret Stone   Age:  72 y.o.  DOB:  1951-06-23                                          MRN:  [id number]         Date:  07/05/2023      Surgeon: Moishe Spice):  Toma Copier, MD    Procedure: Demetrius Charity

## 2023-07-05 NOTE — Discharge Instructions (Signed)
 [facility]  [address]  [place], [place] [address]  [phone]  [email or web address]     NEW PACEMAKER IMPLANT DISCHARGE INSTRUCTIONS    Patient ID:  Margaret Stone  [id number]  72 y.o.  September 30, 1950    Admit Date:

## 2023-07-05 NOTE — Anesthesia Post-Procedure Evaluation (Signed)
 Department of Anesthesiology  Postprocedure Note    Patient: Margaret Stone  MRN: [id number]  Birthdate: January 19, 1951  Date of evaluation: 07/05/2023    Procedure Summary       Date: 07/05/23 Room / Location: [facility] EP LAB 2 / [facility]    Anesthesia St

## 2023-07-05 NOTE — Progress Notes (Signed)
 Patient has walked the hallway of recovery. No SOB, light headiness or dizziness noted.        I have reviewed discharge instructions with the patient.  The patient verbalized understanding.    Discharge medications reviewed with patient and appropriate ed

## 2023-07-05 NOTE — Progress Notes (Addendum)
 Cardiac Cath Lab Recovery Arrival Note:      Margaret Stone arrived to Cardiac Cath Lab, Recovery Area. Staff introduced to patient. Patient identifiers verified with NAME and DATE OF BIRTH. Procedure verified with patient. Consent forms reviewed and signe

## 2023-07-06 LAB — ELECTROPHYSIOLOGY PROCEDURE: Body Surface Area: 2.37 m2
